# Patient Record
Sex: Female | Born: 1935 | Race: White | Hispanic: No | Marital: Married | Smoking: Never smoker
Health system: Southern US, Community
[De-identification: ages and names within clinical notes are randomized; demographics above are authoritative.]

## PROBLEM LIST (undated history)

## (undated) DIAGNOSIS — F0390 Unspecified dementia without behavioral disturbance: Secondary | ICD-10-CM

## (undated) DIAGNOSIS — K5790 Diverticulosis of intestine, part unspecified, without perforation or abscess without bleeding: Secondary | ICD-10-CM

## (undated) DIAGNOSIS — H3530 Unspecified macular degeneration: Secondary | ICD-10-CM

## (undated) DIAGNOSIS — I10 Essential (primary) hypertension: Secondary | ICD-10-CM

## (undated) DIAGNOSIS — T7840XA Allergy, unspecified, initial encounter: Secondary | ICD-10-CM

## (undated) DIAGNOSIS — N3281 Overactive bladder: Secondary | ICD-10-CM

## (undated) DIAGNOSIS — Z972 Presence of dental prosthetic device (complete) (partial): Secondary | ICD-10-CM

## (undated) DIAGNOSIS — R609 Edema, unspecified: Secondary | ICD-10-CM

## (undated) DIAGNOSIS — K08109 Complete loss of teeth, unspecified cause, unspecified class: Secondary | ICD-10-CM

## (undated) DIAGNOSIS — IMO0002 Reserved for concepts with insufficient information to code with codable children: Secondary | ICD-10-CM

## (undated) DIAGNOSIS — K2970 Gastritis, unspecified, without bleeding: Secondary | ICD-10-CM

## (undated) DIAGNOSIS — D649 Anemia, unspecified: Secondary | ICD-10-CM

## (undated) HISTORY — DX: Gastritis, unspecified, without bleeding: K29.70

## (undated) HISTORY — DX: Overactive bladder: N32.81

## (undated) HISTORY — DX: Reserved for concepts with insufficient information to code with codable children: IMO0002

## (undated) HISTORY — DX: Diverticulosis of intestine, part unspecified, without perforation or abscess without bleeding: K57.90

## (undated) HISTORY — DX: Essential (primary) hypertension: I10

## (undated) HISTORY — PX: COLONOSCOPY: SHX174

## (undated) HISTORY — PX: CATARACT EXTRACTION: SUR2

## (undated) HISTORY — DX: Edema, unspecified: R60.9

## (undated) HISTORY — DX: Anemia, unspecified: D64.9

## (undated) HISTORY — DX: Unspecified dementia, unspecified severity, without behavioral disturbance, psychotic disturbance, mood disturbance, and anxiety: F03.90

## (undated) HISTORY — DX: Allergy, unspecified, initial encounter: T78.40XA

## (undated) HISTORY — DX: Unspecified macular degeneration: H35.30

---

## 1997-12-26 LAB — FECAL OCCULT BLOOD, GUAIAC: Fecal Occult Blood: NEGATIVE

## 1999-05-01 ENCOUNTER — Encounter: Payer: Self-pay | Admitting: Family Medicine

## 1999-05-01 ENCOUNTER — Encounter: Admission: RE | Admit: 1999-05-01 | Discharge: 1999-05-01 | Payer: Self-pay | Admitting: Family Medicine

## 2000-05-02 ENCOUNTER — Encounter: Admission: RE | Admit: 2000-05-02 | Discharge: 2000-05-02 | Payer: Self-pay | Admitting: Family Medicine

## 2000-05-02 ENCOUNTER — Encounter: Payer: Self-pay | Admitting: Family Medicine

## 2000-05-02 LAB — HM MAMMOGRAPHY: HM Mammogram: NORMAL

## 2002-08-19 ENCOUNTER — Encounter: Admission: RE | Admit: 2002-08-19 | Discharge: 2002-08-19 | Payer: Self-pay | Admitting: Orthopaedic Surgery

## 2002-08-19 ENCOUNTER — Encounter: Payer: Self-pay | Admitting: Orthopaedic Surgery

## 2002-09-15 ENCOUNTER — Encounter: Payer: Self-pay | Admitting: Orthopaedic Surgery

## 2002-09-15 ENCOUNTER — Encounter: Admission: RE | Admit: 2002-09-15 | Discharge: 2002-09-15 | Payer: Self-pay | Admitting: Orthopaedic Surgery

## 2002-09-15 ENCOUNTER — Encounter: Payer: Self-pay | Admitting: Diagnostic Radiology

## 2002-09-29 ENCOUNTER — Encounter: Payer: Self-pay | Admitting: Orthopaedic Surgery

## 2002-09-29 ENCOUNTER — Encounter: Admission: RE | Admit: 2002-09-29 | Discharge: 2002-09-29 | Payer: Self-pay | Admitting: Orthopaedic Surgery

## 2002-10-09 ENCOUNTER — Inpatient Hospital Stay (HOSPITAL_COMMUNITY): Admission: EM | Admit: 2002-10-09 | Discharge: 2002-10-11 | Payer: Self-pay | Admitting: Emergency Medicine

## 2002-10-09 ENCOUNTER — Encounter: Payer: Self-pay | Admitting: Emergency Medicine

## 2002-10-11 ENCOUNTER — Encounter (INDEPENDENT_AMBULATORY_CARE_PROVIDER_SITE_OTHER): Payer: Self-pay | Admitting: *Deleted

## 2002-10-15 ENCOUNTER — Encounter: Payer: Self-pay | Admitting: Orthopaedic Surgery

## 2002-10-15 ENCOUNTER — Encounter: Admission: RE | Admit: 2002-10-15 | Discharge: 2002-10-15 | Payer: Self-pay | Admitting: Orthopaedic Surgery

## 2002-12-02 ENCOUNTER — Encounter: Payer: Self-pay | Admitting: Orthopaedic Surgery

## 2002-12-02 ENCOUNTER — Encounter: Admission: RE | Admit: 2002-12-02 | Discharge: 2002-12-02 | Payer: Self-pay | Admitting: Orthopaedic Surgery

## 2003-12-03 ENCOUNTER — Encounter: Admission: RE | Admit: 2003-12-03 | Discharge: 2003-12-03 | Payer: Self-pay | Admitting: Orthopaedic Surgery

## 2003-12-12 ENCOUNTER — Encounter: Admission: RE | Admit: 2003-12-12 | Discharge: 2003-12-12 | Payer: Self-pay | Admitting: Orthopaedic Surgery

## 2003-12-26 ENCOUNTER — Encounter: Admission: RE | Admit: 2003-12-26 | Discharge: 2003-12-26 | Payer: Self-pay | Admitting: Orthopaedic Surgery

## 2004-05-18 ENCOUNTER — Ambulatory Visit: Payer: Self-pay | Admitting: Family Medicine

## 2004-06-19 ENCOUNTER — Ambulatory Visit: Payer: Self-pay | Admitting: Family Medicine

## 2004-10-23 ENCOUNTER — Ambulatory Visit: Payer: Self-pay | Admitting: Family Medicine

## 2004-11-13 ENCOUNTER — Ambulatory Visit: Payer: Self-pay | Admitting: Family Medicine

## 2004-12-25 ENCOUNTER — Ambulatory Visit: Payer: Self-pay | Admitting: Family Medicine

## 2005-04-08 ENCOUNTER — Ambulatory Visit: Payer: Self-pay | Admitting: Family Medicine

## 2005-05-15 ENCOUNTER — Ambulatory Visit: Payer: Self-pay | Admitting: Family Medicine

## 2005-11-14 ENCOUNTER — Ambulatory Visit: Payer: Self-pay | Admitting: Family Medicine

## 2006-06-11 ENCOUNTER — Ambulatory Visit: Payer: Self-pay | Admitting: Family Medicine

## 2006-11-26 ENCOUNTER — Ambulatory Visit: Payer: Self-pay | Admitting: Family Medicine

## 2006-11-26 DIAGNOSIS — E7800 Pure hypercholesterolemia, unspecified: Secondary | ICD-10-CM | POA: Insufficient documentation

## 2006-11-26 DIAGNOSIS — I10 Essential (primary) hypertension: Secondary | ICD-10-CM | POA: Insufficient documentation

## 2006-11-28 LAB — CONVERTED CEMR LAB
AST: 16 units/L (ref 0–37)
CO2: 30 meq/L (ref 19–32)
Calcium: 9.3 mg/dL (ref 8.4–10.5)
Chloride: 108 meq/L (ref 96–112)
Cholesterol: 159 mg/dL (ref 0–200)
Glucose, Bld: 96 mg/dL (ref 70–99)
HDL: 32.8 mg/dL — ABNORMAL LOW (ref >39.0)
LDL Cholesterol: 106 mg/dL — ABNORMAL HIGH (ref 0–99)
Sodium: 143 meq/L (ref 135–145)
Triglycerides: 101 mg/dL (ref 0–149)
VLDL: 20 mg/dL (ref 0–40)

## 2006-12-12 ENCOUNTER — Ambulatory Visit: Payer: Self-pay | Admitting: Internal Medicine

## 2006-12-12 DIAGNOSIS — K644 Residual hemorrhoidal skin tags: Secondary | ICD-10-CM | POA: Insufficient documentation

## 2007-01-26 ENCOUNTER — Ambulatory Visit: Payer: Self-pay | Admitting: Family Medicine

## 2007-02-02 ENCOUNTER — Telehealth (INDEPENDENT_AMBULATORY_CARE_PROVIDER_SITE_OTHER): Payer: Self-pay | Admitting: *Deleted

## 2007-02-03 ENCOUNTER — Telehealth (INDEPENDENT_AMBULATORY_CARE_PROVIDER_SITE_OTHER): Payer: Self-pay | Admitting: Internal Medicine

## 2007-02-09 ENCOUNTER — Ambulatory Visit: Payer: Self-pay | Admitting: Family Medicine

## 2007-02-10 ENCOUNTER — Telehealth (INDEPENDENT_AMBULATORY_CARE_PROVIDER_SITE_OTHER): Payer: Self-pay | Admitting: Internal Medicine

## 2007-02-10 LAB — CONVERTED CEMR LAB
Basophils Relative: 3 % — ABNORMAL HIGH (ref 0.0–1.0)
Eosinophils Absolute: 0.3 10*3/uL (ref 0.0–0.6)
Eosinophils Relative: 2.3 % (ref 0.0–5.0)
Hemoglobin: 10.9 g/dL — ABNORMAL LOW (ref 12.0–15.0)
Lymphocytes Relative: 16.6 % (ref 12.0–46.0)
MCV: 78.8 fL (ref 78.0–100.0)
Monocytes Absolute: 0.7 10*3/uL (ref 0.2–0.7)
Neutro Abs: 8.3 10*3/uL — ABNORMAL HIGH (ref 1.4–7.7)
Neutrophils Relative %: 71.8 % (ref 43.0–77.0)
WBC: 11.5 10*3/uL — ABNORMAL HIGH (ref 4.5–10.5)

## 2007-02-12 ENCOUNTER — Ambulatory Visit: Payer: Self-pay | Admitting: Internal Medicine

## 2007-02-12 ENCOUNTER — Ambulatory Visit: Payer: Self-pay | Admitting: Family Medicine

## 2007-02-12 ENCOUNTER — Inpatient Hospital Stay (HOSPITAL_COMMUNITY): Admission: AD | Admit: 2007-02-12 | Discharge: 2007-02-16 | Payer: Self-pay | Admitting: Internal Medicine

## 2007-02-12 ENCOUNTER — Encounter (INDEPENDENT_AMBULATORY_CARE_PROVIDER_SITE_OTHER): Payer: Self-pay | Admitting: Internal Medicine

## 2007-02-13 ENCOUNTER — Encounter: Payer: Self-pay | Admitting: Gastroenterology

## 2007-02-13 ENCOUNTER — Encounter (INDEPENDENT_AMBULATORY_CARE_PROVIDER_SITE_OTHER): Payer: Self-pay | Admitting: Internal Medicine

## 2007-02-13 ENCOUNTER — Encounter: Payer: Self-pay | Admitting: Family Medicine

## 2007-02-13 LAB — HM COLONOSCOPY

## 2007-02-16 ENCOUNTER — Encounter: Payer: Self-pay | Admitting: Family Medicine

## 2007-02-17 ENCOUNTER — Ambulatory Visit: Payer: Self-pay | Admitting: Gastroenterology

## 2007-03-05 DIAGNOSIS — N390 Urinary tract infection, site not specified: Secondary | ICD-10-CM

## 2007-03-05 DIAGNOSIS — M810 Age-related osteoporosis without current pathological fracture: Secondary | ICD-10-CM | POA: Insufficient documentation

## 2007-03-05 DIAGNOSIS — J309 Allergic rhinitis, unspecified: Secondary | ICD-10-CM | POA: Insufficient documentation

## 2007-03-05 DIAGNOSIS — K5730 Diverticulosis of large intestine without perforation or abscess without bleeding: Secondary | ICD-10-CM | POA: Insufficient documentation

## 2007-03-05 DIAGNOSIS — M1990 Unspecified osteoarthritis, unspecified site: Secondary | ICD-10-CM | POA: Insufficient documentation

## 2007-03-05 DIAGNOSIS — H3530 Unspecified macular degeneration: Secondary | ICD-10-CM

## 2007-03-06 ENCOUNTER — Ambulatory Visit: Payer: Self-pay | Admitting: Family Medicine

## 2007-03-06 DIAGNOSIS — K5150 Left sided colitis without complications: Secondary | ICD-10-CM | POA: Insufficient documentation

## 2007-03-09 LAB — CONVERTED CEMR LAB
BUN: 6 mg/dL (ref 6–23)
Basophils Absolute: 0 10*3/uL (ref 0.0–0.1)
Chloride: 101 meq/L (ref 96–112)
Eosinophils Absolute: 0.3 10*3/uL (ref 0.0–0.6)
GFR calc non Af Amer: 101 mL/min
MCHC: 33.5 g/dL (ref 30.0–36.0)
MCV: 78.7 fL (ref 78.0–100.0)
Monocytes Relative: 9.5 % (ref 3.0–11.0)
Neutrophils Relative %: 58.7 % (ref 43.0–77.0)
Platelets: 398 10*3/uL (ref 150–400)
RBC: 4.34 M/uL (ref 3.87–5.11)
Sodium: 139 meq/L (ref 135–145)

## 2007-03-11 ENCOUNTER — Encounter: Payer: Self-pay | Admitting: Family Medicine

## 2007-03-19 ENCOUNTER — Ambulatory Visit: Payer: Self-pay | Admitting: Internal Medicine

## 2007-03-19 LAB — CONVERTED CEMR LAB
Specific Gravity, Urine: 1.025 (ref 1.000–1.03)
Total Protein, Urine: 100 mg/dL — AB
pH: 6 (ref 5.0–8.0)

## 2007-03-22 ENCOUNTER — Encounter: Payer: Self-pay | Admitting: Internal Medicine

## 2007-04-20 ENCOUNTER — Telehealth: Payer: Self-pay | Admitting: Family Medicine

## 2007-04-30 ENCOUNTER — Telehealth: Payer: Self-pay | Admitting: Family Medicine

## 2007-05-27 ENCOUNTER — Encounter (INDEPENDENT_AMBULATORY_CARE_PROVIDER_SITE_OTHER): Payer: Self-pay | Admitting: Internal Medicine

## 2007-05-27 ENCOUNTER — Ambulatory Visit: Payer: Self-pay | Admitting: Family Medicine

## 2007-05-27 ENCOUNTER — Encounter: Payer: Self-pay | Admitting: Internal Medicine

## 2007-05-27 ENCOUNTER — Ambulatory Visit: Payer: Self-pay | Admitting: Internal Medicine

## 2007-05-27 DIAGNOSIS — D649 Anemia, unspecified: Secondary | ICD-10-CM

## 2007-05-27 LAB — CONVERTED CEMR LAB
Bacteria, UA: 0
Nitrite: NEGATIVE
Specific Gravity, Urine: 1.025
Urobilinogen, UA: NEGATIVE
pH: 6

## 2007-06-09 ENCOUNTER — Ambulatory Visit: Payer: Self-pay | Admitting: Family Medicine

## 2007-06-10 ENCOUNTER — Encounter (INDEPENDENT_AMBULATORY_CARE_PROVIDER_SITE_OTHER): Payer: Self-pay | Admitting: Internal Medicine

## 2007-09-01 ENCOUNTER — Encounter: Payer: Self-pay | Admitting: Internal Medicine

## 2007-09-01 ENCOUNTER — Telehealth: Payer: Self-pay | Admitting: Internal Medicine

## 2007-12-11 ENCOUNTER — Ambulatory Visit: Payer: Self-pay | Admitting: Family Medicine

## 2007-12-11 DIAGNOSIS — R350 Frequency of micturition: Secondary | ICD-10-CM

## 2007-12-11 LAB — CONVERTED CEMR LAB
Casts: 0 /lpf
Nitrite: NEGATIVE
Specific Gravity, Urine: <1.005
Urine crystals, microscopic: 0 /hpf
Urobilinogen, UA: 0.2

## 2007-12-12 ENCOUNTER — Encounter: Payer: Self-pay | Admitting: Family Medicine

## 2007-12-15 LAB — CONVERTED CEMR LAB: Vit D, 1,25-Dihydroxy: 22 — ABNORMAL LOW (ref 30–89)

## 2007-12-16 LAB — CONVERTED CEMR LAB
ALT: 11 units/L (ref 0–35)
AST: 20 units/L (ref 0–37)
Albumin: 3.5 g/dL (ref 3.5–5.2)
Alkaline Phosphatase: 75 units/L (ref 39–117)
Basophils Absolute: 0.1 10*3/uL (ref 0.0–0.1)
Basophils Relative: 1.3 % (ref 0.0–3.0)
Calcium: 9.2 mg/dL (ref 8.4–10.5)
Cholesterol: 163 mg/dL (ref 0–200)
Creatinine, Ser: 0.8 mg/dL (ref 0.4–1.2)
Eosinophils Relative: 6 % — ABNORMAL HIGH (ref 0.0–5.0)
GFR calc Af Amer: 87 mL/min
GFR calc non Af Amer: 72 mL/min
Hemoglobin: 12 g/dL (ref 12.0–15.0)
LDL Cholesterol: 101 mg/dL — ABNORMAL HIGH (ref 0–99)
Lymphocytes Relative: 30.2 % (ref 12.0–46.0)
MCHC: 33.8 g/dL (ref 30.0–36.0)
Neutro Abs: 3.2 10*3/uL (ref 1.4–7.7)
Neutrophils Relative %: 52.8 % (ref 43.0–77.0)
Phosphorus: 3.9 mg/dL (ref 2.3–4.6)
RBC: 4.39 M/uL (ref 3.87–5.11)
Sodium: 141 meq/L (ref 135–145)
TSH: 1.26 microintl units/mL (ref 0.35–5.50)
Total Protein: 7.8 g/dL (ref 6.0–8.3)
VLDL: 31 mg/dL (ref 0–40)
WBC: 6.2 10*3/uL (ref 4.5–10.5)

## 2008-02-03 ENCOUNTER — Telehealth (INDEPENDENT_AMBULATORY_CARE_PROVIDER_SITE_OTHER): Payer: Self-pay | Admitting: *Deleted

## 2008-02-09 ENCOUNTER — Telehealth: Payer: Self-pay | Admitting: Internal Medicine

## 2008-02-09 ENCOUNTER — Ambulatory Visit: Payer: Self-pay | Admitting: Gastroenterology

## 2008-02-23 ENCOUNTER — Telehealth: Payer: Self-pay | Admitting: Internal Medicine

## 2008-02-25 ENCOUNTER — Telehealth (INDEPENDENT_AMBULATORY_CARE_PROVIDER_SITE_OTHER): Payer: Self-pay | Admitting: *Deleted

## 2008-06-30 ENCOUNTER — Encounter (INDEPENDENT_AMBULATORY_CARE_PROVIDER_SITE_OTHER): Payer: Self-pay | Admitting: Internal Medicine

## 2008-11-22 ENCOUNTER — Ambulatory Visit: Payer: Self-pay | Admitting: Family Medicine

## 2008-11-22 LAB — CONVERTED CEMR LAB
Ketones, urine, test strip: NEGATIVE
LDL Goal: 130 mg/dL
Nitrite: NEGATIVE
Urobilinogen, UA: 0.2
pH: 6

## 2008-11-23 ENCOUNTER — Encounter: Payer: Self-pay | Admitting: Family Medicine

## 2008-11-25 LAB — CONVERTED CEMR LAB
BUN: 11 mg/dL (ref 6–23)
Basophils Absolute: 0.1 10*3/uL (ref 0.0–0.1)
CO2: 30 meq/L (ref 19–32)
Creatinine, Ser: 0.8 mg/dL (ref 0.4–1.2)
GFR calc non Af Amer: 71.85 mL/min (ref >60)
Glucose, Bld: 93 mg/dL (ref 70–99)
HCT: 33.5 % — ABNORMAL LOW (ref 36.0–46.0)
HDL: 27.2 mg/dL — ABNORMAL LOW (ref >39.00)
Lymphs Abs: 1.6 10*3/uL (ref 0.7–4.0)
MCV: 75 fL — ABNORMAL LOW (ref 78.0–100.0)
Monocytes Absolute: 0.6 10*3/uL (ref 0.1–1.0)
Monocytes Relative: 8.4 % (ref 3.0–12.0)
Neutrophils Relative %: 58.8 % (ref 43.0–77.0)
Platelets: 449 10*3/uL — ABNORMAL HIGH (ref 150.0–400.0)
RDW: 15.1 % — ABNORMAL HIGH (ref 11.5–14.6)
TSH: 1.54 microintl units/mL (ref 0.35–5.50)
Total CHOL/HDL Ratio: 6
Triglycerides: 186 mg/dL — ABNORMAL HIGH (ref 0.0–149.0)
VLDL: 37.2 mg/dL (ref 0.0–40.0)

## 2008-11-30 ENCOUNTER — Encounter (INDEPENDENT_AMBULATORY_CARE_PROVIDER_SITE_OTHER): Payer: Self-pay | Admitting: *Deleted

## 2008-12-29 ENCOUNTER — Ambulatory Visit: Payer: Self-pay | Admitting: Family Medicine

## 2008-12-30 LAB — CONVERTED CEMR LAB
Basophils Absolute: 0 10*3/uL (ref 0.0–0.1)
Eosinophils Absolute: 0.3 10*3/uL (ref 0.0–0.7)
Ferritin: 8.8 ng/mL — ABNORMAL LOW (ref 10.0–291.0)
MCHC: 32.9 g/dL (ref 30.0–36.0)
MCV: 76.6 fL — ABNORMAL LOW (ref 78.0–100.0)
Monocytes Absolute: 0.6 10*3/uL (ref 0.1–1.0)
Neutrophils Relative %: 49.3 % (ref 43.0–77.0)
Platelets: 363 10*3/uL (ref 150.0–400.0)
RDW: 16.6 % — ABNORMAL HIGH (ref 11.5–14.6)

## 2009-01-02 ENCOUNTER — Telehealth: Payer: Self-pay | Admitting: Family Medicine

## 2009-02-07 ENCOUNTER — Telehealth: Payer: Self-pay | Admitting: Internal Medicine

## 2009-04-03 ENCOUNTER — Ambulatory Visit: Payer: Self-pay | Admitting: Family Medicine

## 2009-04-03 LAB — CONVERTED CEMR LAB
AST: 16 units/L (ref 0–37)
BUN: 13 mg/dL (ref 6–23)
CO2: 29 meq/L (ref 19–32)
Chloride: 107 meq/L (ref 96–112)
Cholesterol: 178 mg/dL (ref 0–200)
Eosinophils Relative: 0 % (ref 0.0–5.0)
Ferritin: 24.9 ng/mL (ref 10.0–291.0)
GFR calc non Af Amer: 71.79 mL/min (ref >60)
HCT: 38.6 % (ref 36.0–46.0)
Hemoglobin: 12.5 g/dL (ref 12.0–15.0)
LDL Cholesterol: 105 mg/dL — ABNORMAL HIGH (ref 0–99)
Lymphs Abs: 1.8 10*3/uL (ref 0.7–4.0)
Monocytes Relative: 7.5 % (ref 3.0–12.0)
Platelets: 321 10*3/uL (ref 150.0–400.0)
RBC: 4.76 M/uL (ref 3.87–5.11)
WBC: 4.8 10*3/uL (ref 4.5–10.5)

## 2009-04-12 ENCOUNTER — Ambulatory Visit: Payer: Self-pay | Admitting: Family Medicine

## 2009-10-09 ENCOUNTER — Ambulatory Visit: Payer: Self-pay | Admitting: Family Medicine

## 2009-10-09 DIAGNOSIS — F0390 Unspecified dementia without behavioral disturbance: Secondary | ICD-10-CM

## 2009-10-10 LAB — CONVERTED CEMR LAB
ALT: 9 units/L (ref 0–35)
AST: 15 units/L (ref 0–37)
Albumin: 4.1 g/dL (ref 3.5–5.2)
BUN: 15 mg/dL (ref 6–23)
Cholesterol: 184 mg/dL (ref 0–200)
Creatinine, Ser: 0.7 mg/dL (ref 0.4–1.2)
Eosinophils Absolute: 0 10*3/uL (ref 0.0–0.7)
Glucose, Bld: 85 mg/dL (ref 70–99)
HDL: 41.3 mg/dL (ref >39.00)
MCHC: 33.5 g/dL (ref 30.0–36.0)
MCV: 85 fL (ref 78.0–100.0)
Monocytes Absolute: 0.4 10*3/uL (ref 0.1–1.0)
Neutrophils Relative %: 74.6 % (ref 43.0–77.0)
Phosphorus: 4.3 mg/dL (ref 2.3–4.6)
Platelets: 322 10*3/uL (ref 150.0–400.0)
Potassium: 5 meq/L (ref 3.5–5.1)
RDW: 13 % (ref 11.5–14.6)
VLDL: 43.2 mg/dL — ABNORMAL HIGH (ref 0.0–40.0)

## 2009-10-12 ENCOUNTER — Ambulatory Visit: Payer: Self-pay | Admitting: Family Medicine

## 2009-10-12 LAB — CONVERTED CEMR LAB: Vit D, 25-Hydroxy: 27 ng/mL — ABNORMAL LOW (ref 30–89)

## 2009-10-19 ENCOUNTER — Ambulatory Visit: Payer: Self-pay | Admitting: Family Medicine

## 2009-10-19 ENCOUNTER — Telehealth: Payer: Self-pay | Admitting: Family Medicine

## 2009-10-26 ENCOUNTER — Ambulatory Visit: Payer: Self-pay | Admitting: Family Medicine

## 2009-10-31 ENCOUNTER — Telehealth: Payer: Self-pay | Admitting: Internal Medicine

## 2009-11-02 ENCOUNTER — Ambulatory Visit: Payer: Self-pay | Admitting: Family Medicine

## 2009-11-09 ENCOUNTER — Ambulatory Visit: Payer: Self-pay | Admitting: Family Medicine

## 2009-11-09 DIAGNOSIS — E538 Deficiency of other specified B group vitamins: Secondary | ICD-10-CM

## 2009-11-14 ENCOUNTER — Telehealth: Payer: Self-pay | Admitting: Family Medicine

## 2009-11-23 ENCOUNTER — Telehealth: Payer: Self-pay | Admitting: Family Medicine

## 2009-11-24 ENCOUNTER — Telehealth: Payer: Self-pay | Admitting: Family Medicine

## 2009-12-04 ENCOUNTER — Telehealth: Payer: Self-pay | Admitting: Family Medicine

## 2009-12-07 ENCOUNTER — Ambulatory Visit: Payer: Self-pay | Admitting: Internal Medicine

## 2009-12-13 ENCOUNTER — Telehealth: Payer: Self-pay | Admitting: Family Medicine

## 2009-12-27 ENCOUNTER — Telehealth: Payer: Self-pay | Admitting: Family Medicine

## 2010-03-27 NOTE — Progress Notes (Signed)
Summary: when does pt need labs?  Phone Note Call from Patient Call back at Home Phone [phone] Call back at [phone]   Caller: Daughter  Becky Summary of Call: Pt's daughter is asking when is pt due for more blood work.  She had appt to see you today but daughter cancelled it because pt didnt feel like coming in. Initial call taken by: Marty Heck CMA, AAMA,  December 27, 2009 10:30 AM  Follow-up for Phone Call        I think we could wait until feb for labs -- B12, vit D , renal / ast/alt and lipids 272, 401.1, B12 def and vit D def   Follow-up by: Allena Earing MD,  December 27, 2009 11:23 AM  Additional Follow-up for Phone Call Additional follow up Details #1::        Patient's daughter  notified as instructed by telephone. Pt scheduled 04/02/09 at 11:30 to see Dr Glori Bickers with labs to follow. Pt's daughter wanted pt to see Dr Glori Bickers when she had labs.Ozzie Hoyle LPN  December 28, 8567 12:44 PM

## 2010-03-27 NOTE — Assessment & Plan Note (Signed)
Summary: VIT B12 INJECTION PER DR TOWER/RI   Nurse Visit   Allergies: 1)  ! Ace Inhibitors 2)  ! Lipitor 3)  ! * Tenex 4)  ! * Vytorin 5)  ! * Hctz 6)  ! * Miaicalcin 7)  ! * Rhinacort Aqua 8)  ! * Detrol La  Medication Administration  Injection # 1:    Medication: Vit B12 1000 mcg    Diagnosis: ANEMIA (TOI-712.9)    Route: IM    Site: L deltoid    Exp Date: 05/27/2011    Lot #: [id number]    Mfr: [employer] Regent    Patient tolerated injection without complications    Given by: Edwin Dada CMA ([place]) (October 26, 2009 10:11 AM)  Orders Added: 1)  Vit B12 1000 mcg [J3420] 2)  Admin of Therapeutic Inj  intramuscular or subcutaneous [[id number]]   Medication Administration  Injection # 1:    Medication: Vit B12 1000 mcg    Diagnosis: ANEMIA (ICD-285.9)    Route: IM    Site: L deltoid    Exp Date: 05/27/2011    Lot #: [id number]    Mfr: American Regent    Patient tolerated injection without complications    Given by: Edwin Dada CMA ([place]) (October 26, 2009 10:11 AM)  Orders Added: 1)  Vit B12 1000 mcg [J3420] 2)  Admin of Therapeutic Inj  intramuscular or subcutaneous [[id number]]

## 2010-03-27 NOTE — Assessment & Plan Note (Signed)
Summary: VIT B 12 INJECTION PER DR TOWER/RI   Nurse Visit   Allergies: 1)  ! Ace Inhibitors 2)  ! Lipitor 3)  ! * Tenex 4)  ! * Vytorin 5)  ! * Hctz 6)  ! * Miaicalcin 7)  ! * Rhinacort Aqua 8)  ! * Detrol La  Medication Administration  Injection # 1:    Medication: Vit B12 1000 mcg    Diagnosis: ANEMIA (RWP-100.9)    Route: IM    Site: L deltoid    Exp Date: 05/2011    Lot #: [id number]    Mfr: American Regent    Patient tolerated injection without complications    Given by: Marty Heck CMA (October 13, 2009 3:02 PM)  Orders Added: 1)  Vit B12 1000 mcg [J3420] 2)  Admin of Therapeutic Inj  intramuscular or subcutaneous [96372]   Medication Administration  Injection # 1:    Medication: Vit B12 1000 mcg    Diagnosis: ANEMIA (ICD-285.9)    Route: IM    Site: L deltoid    Exp Date: 05/2011    Lot #: [id number]    Mfr: American Regent    Patient tolerated injection without complications    Given by: Marty Heck CMA (October 13, 2009 3:02 PM)  Orders Added: 1)  Vit B12 1000 mcg [J3420] 2)  Admin of Therapeutic Inj  intramuscular or subcutaneous [96372] Injection was actually given yesterday, computers were down so I was unable to record.

## 2010-03-27 NOTE — Miscellaneous (Signed)
Summary: Vitamin D 2000iu added to med list  Medications Added VITAMIN D 2000 UNIT TABS (CHOLECALCIFEROL) Take 1 tablet by mouth two times a day       Clinical Lists Changes  Medications: Added new medication of VITAMIN D 2000 UNIT TABS (CHOLECALCIFEROL) Take 1 tablet by mouth two times a day     Current Allergies: ! ACE INHIBITORS ! LIPITOR ! * TENEX ! * VYTORIN ! * HCTZ ! * MIAICALCIN ! * RHINACORT AQUA ! * DETROL LA

## 2010-03-27 NOTE — Progress Notes (Signed)
Summary: Handicapped Placard  Phone Note Call from Patient Call back at Home Phone [phone]   Caller: Patient Call For: Allena Earing MD Summary of Call: Patient came in today for Vitamin B12 injection and dropped off form for handicapped placard.  Please call when form ready, form in your IN box.   Initial call taken by: Sherrian Divers CMA Deborra Medina),  October 19, 2009 10:20 AM  Follow-up for Phone Call        form done and in nurse in box  Follow-up by: Allena Earing MD,  October 19, 2009 11:17 AM  Additional Follow-up for Phone Call Additional follow up Details #1::        Left message for patient's daughter Ashley House to call back. Form left at front desk. Copy of form sent for scanning. Ozzie Hoyle LPN  October 20, 7891 2:26 PM   Patients daughter Ashley House notified as instructed by telephone. Will pick up 10/26/09.Ozzie Hoyle LPN  October 20, 8099 8:38 AM

## 2010-03-27 NOTE — Progress Notes (Signed)
Summary: pt is depressed  Phone Note Call from Patient   Caller: Daughter Vaughan Basta  Call For: Allena Earing MD Summary of Call: Pt's daughter is asking if pt can be started on anti depressant.  She is agitated, up one day and down the next.  Daughter does not want pt to have aricept because of the side effects.  Daughter thinks pt is depressed because she cant see and cant do, and she has always been very active.  [facility] [address].  Please call daughter Jacqlyn Larsen and let her know, [phone]. Initial call taken by: Marty Heck CMA,  December 13, 2009 11:52 AM  Follow-up for Phone Call        I need her to f/u with me for this -- we will disc symptoms in detail to figure out what med to try and also disc pros/ cons/ side eff to antidepressants please sched 30 min if poss Follow-up by: Allena Earing MD,  December 13, 2009 12:45 PM  Additional Follow-up for Phone Call Additional follow up Details #1::        Jacqlyn Larsen, pt's daughter  notified as instructed by telephone. 30 min appt scheduled 12/27/09 at 12:15pm. Becky understands that pt needs to know why she is coming in for the appt per Dr Glori Bickers.Ozzie Hoyle LPN  December 13, 1101 3:18 PM

## 2010-03-27 NOTE — Progress Notes (Signed)
Summary: Wants generic Aricept  Phone Note Call from Patient Call back at [phone]   Caller: Patient's daughter, Ashley House Call For: Allena Earing MD Summary of Call: Pt's daughter, Ashley House wants to try pt on Aricept generic. Ashley House said had discussed previously with dr Glori Bickers. Would like 30 day supply of the generic Aricept [phone]. Ashley House will call with how pt responds to med. Ashley House can be reached [phone]. Initial call taken by: Ozzie Hoyle LPN,  November 24, 8314 10:27 AM  Follow-up for Phone Call        px written on EMR for call in will start with 5 -- if ok with that after several months will inc f/u with me 2 mo   Follow-up by: Allena Earing MD,  November 23, 2009 12:12 PM  Additional Follow-up for Phone Call Additional follow up Details #1::        Patient's daughter, Ashley House notified as instructed by telephone. Ashley House will ck her schedule and call and make f/u appt. Medication [facility]  [place] as instructed. Ozzie Hoyle LPN  November 24, 7423 1:02 PM     New/Updated Medications: ARICEPT 5 MG TABS (DONEPEZIL HCL) 1 by mouth once daily in evening Prescriptions: ARICEPT 5 MG TABS (DONEPEZIL HCL) 1 by mouth once daily in evening  #30 x 5   Entered and Authorized by:   Allena Earing MD   Signed by:   Ozzie Hoyle LPN on 52/58/9483   Method used:   Telephoned to ...       [id number]       [address]       [place],   [address]       Ph: [phone]       Fax: [phone]   RxID:   [id number]

## 2010-03-27 NOTE — Assessment & Plan Note (Signed)
Summary: ROA 6 MTHS CYD   Vital Signs:  Patient profile:   77 year old female Weight:      135 pounds BMI:     24.78 Temp:     97.9 degrees F oral Pulse rate:   60 / minute Pulse rhythm:   regular BP sitting:   122 / 68  (left arm) Cuff size:   regular  Vitals Entered By: Emelia Salisbury LPN (October 10, 6211 11:47 AM) CC: 6 Month follow-up   History of Present Illness: here for f/u of HTN and anemia and lipids   is very difficult to get by with her loss of vision  also getting older -- and this is hard on her --77 years old  she gives herself a lot of credit -- somewhat self sufficient  has a walker and does not like it -- is more in her way   afraid to fall but not falls recently   HTN in good control with 122/68 today that has been very stable  needs refil on her med   gets her iron pills otc   some short term memory issues -- repeats herself occas  gets confused in new places  is up and down at night lately  husb is there to help and daughter to help both of them  they check on her meds   snacks all the time -- instead of eating meals   is time for labs for anemia and chol  pneumovax - is due for     Allergies: 1)  ! Ace Inhibitors 2)  ! Lipitor 3)  ! * Tenex 4)  ! * Vytorin 5)  ! * Hctz 6)  ! * Miaicalcin 7)  ! * Rhinacort Aqua 8)  ! * Detrol La  Past History:  Past Medical History: Last updated: 11/22/2008 Allergic rhinitis Diverticulosis, colon Hypertension Osteoarthritis Osteoporosis Left-sided ulcerative colitis 12/08 gastritis anemia ankle edema  macular degeneration- is legally blind  overactive bladder   Past Surgical History: Last updated: 06/30/2008 Lumbar disk disease Dexa- LS osteoporosis, left hip osteopenia (12/1997) Admit- lowe GI bleed (09/2002) Abn pap- ulcer, benign Colonoscopy- severe diverticulosis, polyp (10/2002), 12/08 = L UC, 8/04--colitis, diverticulosis, hemorrhoids EGD--10/11/02--hiatal hernia,  gastritis--Stark Cataract extraction- left (09/2003) Deg disk- back 12/08 hosp colitis, gastritis, uti  Family History: Last updated: 02/09/2008 Father:  Mother:  Siblings: brother- colon cancer no family hx of colitis  Social History: Last updated: 02/09/2008 Marital Status: Children: 2 girls, 2 boys Occupation: retired non smoker Alcohol Use - no Daily Caffeine Use-ice tea Illicit Drug Use - no Patient does not get regular exercise.   Risk Factors: Exercise: no (02/09/2008)  Risk Factors: Smoking Status: never (03/05/2007)  Review of Systems General:  Complains of fatigue; denies malaise and sweats. Eyes:  Complains of blurring and vision loss-both eyes; denies eye irritation and eye pain. CV:  Denies chest pain or discomfort, palpitations, shortness of breath with exertion, and swelling of feet. Resp:  Denies cough, shortness of breath, and wheezing. GI:  Denies abdominal pain and change in bowel habits; colitis has been doing ok . GU:  Complains of incontinence; denies dysuria. MS:  Complains of joint pain and stiffness. Derm:  Denies itching, lesion(s), poor wound healing, and rash. Neuro:  Complains of memory loss; denies inability to speak, numbness, tingling, tremors, and weakness. Psych:  Denies anxiety and depression. Endo:  Denies cold intolerance, excessive thirst, excessive urination, and heat intolerance. Heme:  Denies abnormal bruising and bleeding.  Physical Exam  General:  somewhat frail appearing elderly female Head:  normocephalic, atraumatic, and no abnormalities observed.   Eyes:  pupils equal, pupils round, and pupils reactive to light.  vision is baseline poor Ears:  R ear normal and L ear normal.   Mouth:  pharynx pink and moist.   Neck:  supple with full rom and no masses or thyromegally, no JVD or carotid bruit  Chest Wall:  No deformities, masses, or tenderness noted. Lungs:  Normal respiratory effort, chest expands symmetrically. Lungs  are clear to auscultation, no crackles or wheezes. Heart:  Normal rate and regular rhythm. S1 and S2 normal without gallop, murmur, click, rub or other extra sounds. Abdomen:  Bowel sounds positive,abdomen soft and non-tender without masses, organomegaly or hernias noted. no renal bruits  Msk:  No deformity or scoliosis noted of thoracic or lumbar spine.  no acute joint changes  Pulses:  R and L carotid,radial,femoral,dorsalis pedis and posterior tibial pulses are full and equal bilaterally Extremities:  No clubbing, cyanosis, edema, or deformity noted with normal full range of motion of all joints.   Neurologic:  sensation intact to light touch, gait normal, and DTRs symmetrical and normal.   Skin:  Intact without suspicious lesions or rashes Cervical Nodes:  No lymphadenopathy noted Inguinal Nodes:  No significant adenopathy Psych:  normal affect, talkative and pleasant  is mentally sharp today   Impression & Recommendations:  Problem # 1:  ANEMIA (ICD-285.9) Assessment Unchanged  this has been stable with colitis and low dose iron otc  re check today Orders: Venipuncture (26834) TLB-Lipid Panel (80061-LIPID) TLB-Renal Function Panel (80069-RENAL) TLB-TSH (Thyroid Stimulating Hormone) (84443-TSH) TLB-CBC Platelet - w/Differential (85025-CBCD) TLB-ALT (SGPT) (84460-ALT) TLB-AST (SGOT) (84450-SGOT) TLB-B12 + Folate Pnl (82746_82607-B12/FOL) T-Vitamin D (25-Hydroxy) (19622-29798)  Hgb: 12.5 (04/03/2009)   Hct: 38.6 (04/03/2009)   Platelets: 321.0 (04/03/2009) RBC: 4.76 (04/03/2009)   RDW: 16.8 (04/03/2009)   WBC: 4.8 (04/03/2009) MCV: 81.0 (04/03/2009)   MCHC: 32.4 (04/03/2009) Ferritin: 24.9 (04/03/2009) TSH: 1.54 (11/22/2008)  Problem # 2:  HYPERTENSION (ICD-401.9) Assessment: Unchanged  good control without change  lab today Her updated medication list for this problem includes:    Norvasc 5 Mg Tabs (Amlodipine besylate) .Marland Kitchen... Take 1 tablet by mouth once a  day  Orders: Venipuncture (92119) TLB-Lipid Panel (80061-LIPID) TLB-Renal Function Panel (80069-RENAL) TLB-TSH (Thyroid Stimulating Hormone) (84443-TSH) TLB-CBC Platelet - w/Differential (85025-CBCD) TLB-ALT (SGPT) (84460-ALT) TLB-AST (SGOT) (84450-SGOT) TLB-B12 + Folate Pnl (82746_82607-B12/FOL) T-Vitamin D (25-Hydroxy) (41740-81448)  BP today: 122/68 Prior BP: 118/64 (04/12/2009)  Prior 10 Yr Risk Heart Disease: 20 % (11/22/2008)  Labs Reviewed: K+: 4.4 (04/03/2009) Creat: : 0.8 (04/03/2009)   Chol: 178 (04/03/2009)   HDL: 44.60 (04/03/2009)   LDL: 105 (04/03/2009)   TG: 142.0 (04/03/2009)  Problem # 3:  MEMORY LOSS (ICD-780.93) Assessment: New highly suspect short term memory loss and easy confusion is age related change and disc this in detail pt is quite lucid today has safe home env with husband and lots of extra help some extra labs today gave fam member the option to investigate poss of aricept and let me know   Orders: Venipuncture (18563) TLB-Lipid Panel (80061-LIPID) TLB-Renal Function Panel (80069-RENAL) TLB-TSH (Thyroid Stimulating Hormone) (84443-TSH) TLB-CBC Platelet - w/Differential (85025-CBCD) TLB-ALT (SGPT) (84460-ALT) TLB-AST (SGOT) (84450-SGOT) TLB-B12 + Folate Pnl (82746_82607-B12/FOL) T-Vitamin D (25-Hydroxy) (14970-26378)  Problem # 4:  HYPERCHOLESTEROLEMIA, PURE (ICD-272.0) Assessment: Unchanged  labs today  has been well controlled with statin  pt eats what she wants- which is  not much  ice cream may elevate numbers- check today Her updated medication list for this problem includes:    Pravachol 40 Mg Tabs (Pravastatin sodium) .Marland Kitchen... Take 1 tablet by mouth once a day  Orders: Venipuncture (19758) TLB-Lipid Panel (80061-LIPID) TLB-Renal Function Panel (80069-RENAL) TLB-TSH (Thyroid Stimulating Hormone) (84443-TSH) TLB-CBC Platelet - w/Differential (85025-CBCD) TLB-ALT (SGPT) (84460-ALT) TLB-AST (SGOT) (84450-SGOT) TLB-B12 + Folate Pnl  [phone]) T-Vitamin D (25-Hydroxy) [phone])  Labs Reviewed: SGOT: 16 (04/03/2009)   SGPT: 9 (04/03/2009)  Lipid Goals: Chol Goal: 200 (11/22/2008)   HDL Goal: 40 (11/22/2008)   LDL Goal: 130 (11/22/2008)   TG Goal: 150 (11/22/2008)  Prior 10 Yr Risk Heart Disease: 20 % (11/22/2008)   HDL:44.60 (04/03/2009), 27.20 (11/22/2008)  LDL:105 (04/03/2009), 88 (11/22/2008)  Chol:178 (04/03/2009), 152 (11/22/2008)  Trig:142.0 (04/03/2009), 186.0 (11/22/2008)  Complete Medication List: 1)  Norvasc 5 Mg Tabs (Amlodipine besylate) .... Take 1 tablet by mouth once a day 2)  Pravachol 40 Mg Tabs (Pravastatin sodium) .... Take 1 tablet by mouth once a day 3)  Asacol 400 Mg Tbec (Mesalamine) .... 3  by mouth two times a day 4)  Iron Pill Otc Unsure of Mg  .... Otc as directed. 5)  Claritin 10 Mg Tabs (Loratadine) .Marland Kitchen.. 1 by mouth once daily as needed allergies  Other Orders: Pneumococcal Vaccine [phone]) Admin 1st Vaccine [phone]) Admin 1st Vaccine [facility]) [phone]) Specimen Handling (99000)  Patient Instructions: 1)  the medication to consider for dementia is aricept - look it up on line and let me know if you are interested  2)  labs today 3)  no change in medicine  4)  follow up in about 6 months   Current Allergies (reviewed today): ! ACE INHIBITORS ! LIPITOR ! * TENEX ! * VYTORIN ! * HCTZ ! * MIAICALCIN ! * RHINACORT AQUA ! * DETROL LA    Pneumovax Vaccine    Vaccine Type: Pneumovax    Site: left deltoid    Mfr: Merck    Dose: 0.5 ml    Route: IM    Given by: Emelia Salisbury LPN    Exp. Date: 03/14/2011    Lot #: [id number]    VIS given: 09/23/95 version given October 09, 2009.

## 2010-03-27 NOTE — Assessment & Plan Note (Signed)
Summary: 3 MTH FU/CLE   Vital Signs:  Patient profile:   77 year old House Height:      62 inches Weight:      138.25 pounds Temp:     97.6 degrees F oral Pulse rate:   72 / minute Pulse rhythm:   regular BP sitting:   118 / 64  (left arm) Cuff size:   regular  Vitals Entered By: Ozzie Hoyle LPN (April 12, 4494 11:27 AM)  History of Present Illness: here for f/u of lipids and HTN and anemia   is doing well overall - has chronic pain from oa does have constant allergy problems - all the time  constantly wiping nose  no otc meds  no fever no sinus pain or malaise   lipids stable with statin and diet trig 142/ HDL 44 and LDL 105 not a big eater in general - not much fatty foods   ferritin ok and nl hb on iron  is taking a low dose iron pill daily   HTN in good control today - no changes no headache or flushing   is becoming more and more forgetful lately - short term memory   her husband is still living and lots of family - also dauhter that is a nurse  does tend to get up during the night  has to get up to go to bathroom  does have bedside commode- does not use it    Allergies: 1)  ! Ace Inhibitors 2)  ! Lipitor 3)  ! * Tenex 4)  ! * Vytorin 5)  ! * Hctz 6)  ! * Miaicalcin 7)  ! * Rhinacort Aqua 8)  ! * Detrol La  Past History:  Past Medical History: Last updated: 11/22/2008 Allergic rhinitis Diverticulosis, colon Hypertension Osteoarthritis Osteoporosis Left-sided ulcerative colitis 12/08 gastritis anemia ankle edema  macular degeneration- is legally blind  overactive bladder   Past Surgical History: Last updated: 06/30/2008 Lumbar disk disease Dexa- LS osteoporosis, left hip osteopenia (12/1997) Admit- lowe GI bleed (09/2002) Abn pap- ulcer, benign Colonoscopy- severe diverticulosis, polyp (10/2002), 12/08 = L UC, 8/04--colitis, diverticulosis, hemorrhoids EGD--10/11/02--hiatal hernia, gastritis--Stark Cataract extraction- left  (09/2003) Deg disk- back 12/08 hosp colitis, gastritis, uti  Family History: Last updated: 02/09/2008 Father:  Mother:  Siblings: brother- colon cancer no family hx of colitis  Social History: Last updated: 02/09/2008 Marital Status: Children: 2 girls, 2 boys Occupation: retired non smoker Alcohol Use - no Daily Caffeine Use-ice tea Illicit Drug Use - no Patient does not get regular exercise.   Risk Factors: Exercise: no (02/09/2008)  Risk Factors: Smoking Status: never (03/05/2007)  Review of Systems General:  Denies fatigue, fever, loss of appetite, and malaise. Eyes:  Denies blurring and eye irritation. CV:  Denies chest pain or discomfort, palpitations, and shortness of breath with exertion. Resp:  Denies cough and wheezing. GI:  Denies abdominal pain and change in bowel habits. GU:  Complains of incontinence, nocturia, and urinary frequency; denies dysuria. MS:  Complains of joint pain; denies joint redness and joint swelling. Derm:  Denies itching, lesion(s), poor wound healing, and rash. Neuro:  Denies numbness and tingling. Psych:  mood is ok . Endo:  Denies excessive thirst and excessive urination.  Physical Exam  General:  somewhat frail appearing elderly House Head:  normocephalic, atraumatic, and no abnormalities observed.   Eyes:  pupils equal, pupils round, and pupils reactive to light.  vision is baseline poor Ears:  R ear normal and L ear  normal.   Mouth:  pharynx pink and moist.   Neck:  supple with full rom and no masses or thyromegally, no JVD or carotid bruit  Chest Wall:  No deformities, masses, or tenderness noted. Lungs:  Normal respiratory effort, chest expands symmetrically. Lungs are clear to auscultation, no crackles or wheezes. Heart:  Normal rate and regular rhythm. S1 and S2 normal without gallop, murmur, click, rub or other extra sounds. Abdomen:  Bowel sounds positive,abdomen soft and non-tender without masses, organomegaly or  hernias noted. no renal bruits  Msk:  No deformity or scoliosis noted of thoracic or lumbar spine.   Pulses:  R and L carotid,radial,femoral,dorsalis pedis and posterior tibial pulses are full and equal bilaterally Extremities:  No clubbing, cyanosis, edema, or deformity noted with normal full range of motion of all joints.   Neurologic:  sensation intact to light touch, gait normal, and DTRs symmetrical and normal.   Skin:  Intact without suspicious lesions or rashes Cervical Nodes:  No lymphadenopathy noted Inguinal Nodes:  No significant adenopathy Psych:  normal affect, talkative and pleasant    Impression & Recommendations:  Problem # 1:  HYPERTENSION, BENIGN ESSENTIAL (ICD-401.1) Assessment Unchanged  good control without change disc low sodium diet no change in med -px for mail order Her updated medication list for this problem includes:    Norvasc 5 Mg Tabs (Amlodipine besylate) .Marland Kitchen... Take 1 tablet by mouth once a day  BP today: 118/64 Prior BP: 120/60 (11/22/2008)  Prior 10 Yr Risk Heart Disease: 20 % (11/22/2008)  Labs Reviewed: K+: 4.4 (04/03/2009) Creat: : 0.8 (04/03/2009)   Chol: 178 (04/03/2009)   HDL: 44.60 (04/03/2009)   LDL: 105 (04/03/2009)   TG: 142.0 (04/03/2009)  Problem # 2:  HYPERCHOLESTEROLEMIA, PURE (ICD-272.0) Assessment: Unchanged  good control with pravachol and diet  no change  f/u 6 mo  rev labs and low sat fat diet today Her updated medication list for this problem includes:    Pravachol 40 Mg Tabs (Pravastatin sodium) .Marland Kitchen... Take 1 tablet by mouth once a day  Labs Reviewed: SGOT: 16 (04/03/2009)   SGPT: 9 (04/03/2009)  Lipid Goals: Chol Goal: 200 (11/22/2008)   HDL Goal: 40 (11/22/2008)   LDL Goal: 130 (11/22/2008)   TG Goal: 150 (11/22/2008)  Prior 10 Yr Risk Heart Disease: 20 % (11/22/2008)   HDL:44.60 (04/03/2009), 27.20 (11/22/2008)  LDL:105 (04/03/2009), 88 (11/22/2008)  Chol:178 (04/03/2009), 152 (11/22/2008)  Trig:142.0  (04/03/2009), 186.0 (11/22/2008)  Problem # 3:  LEFT SIDED ULCERATIVE COLITIS (ICD-556.5) Assessment: Comment Only symptom free and anemia is resolved continue GI f/u and asacol  Problem # 4:  ANEMIA (ICD-285.9) Assessment: Improved  resolved with tx of GI problems and low dose iron daily  will continue to follow  The following medications were removed from the medication list:    Ferrous Sulfate 325 (65 Fe) Mg Tabs (Ferrous sulfate) .Marland Kitchen... Take 1 tablet by mouth once a day  Hgb: 12.5 (04/03/2009)   Hct: 38.6 (04/03/2009)   Platelets: 321.0 (04/03/2009) RBC: 4.76 (04/03/2009)   RDW: 16.8 (04/03/2009)   WBC: 4.8 (04/03/2009) MCV: 81.0 (04/03/2009)   MCHC: 32.4 (04/03/2009) Ferritin: 24.9 (04/03/2009) TSH: 1.54 (11/22/2008)  Problem # 5:  ALLERGIC RHINITIS (ICD-477.9) Assessment: Deteriorated  with rhinorrhea adv to try claritin otc and update disc allergen avoidance if possible  Her updated medication list for this problem includes:    Claritin 10 Mg Tabs (Loratadine) .Marland Kitchen... 1 by mouth once daily as needed allergies  Complete Medication List: 1)  Norvasc 5 Mg Tabs (Amlodipine besylate) .... Take 1 tablet by mouth once a day 2)  Pravachol 40 Mg Tabs (Pravastatin sodium) .... Take 1 tablet by mouth once a day 3)  Asacol 400 Mg Tbec (Mesalamine) .... 3  by mouth two times a day 4)  Iron Pill Otc Unsure of Mg  .... Otc as directed. 5)  Claritin 10 Mg Tabs (Loratadine) .Marland Kitchen.. 1 by mouth once daily as needed allergies  Patient Instructions: 1)  you can try claritin over the counter 10 mg daily for runny nose  2)  stay as active as possibe and eat a balanced diet  3)  follow up with me in 6 months or earlier if needed  Prescriptions: PRAVACHOL 40 MG  TABS (PRAVASTATIN SODIUM) Take 1 tablet by mouth once a day  #90 x 3   Entered and Authorized by:   Allena Earing MD   Signed by:   Allena Earing MD on 04/12/2009   Method used:   Print then Give to Patient   RxID:    [id number] NORVASC 5 MG  TABS (AMLODIPINE BESYLATE) Take 1 tablet by mouth once a day  #90 x 3   Entered and Authorized by:   Allena Earing MD   Signed by:   Allena Earing MD on 04/12/2009   Method used:   Print then Give to Patient   RxID:   [id number]   Current Allergies (reviewed today): ! ACE INHIBITORS ! LIPITOR ! * TENEX ! * VYTORIN ! * HCTZ ! * MIAICALCIN ! * RHINACORT AQUA ! * DETROL LA

## 2010-03-27 NOTE — Progress Notes (Signed)
Summary: Refill Asacol   Phone Note Call from Patient Call back at Home Phone [phone]   Caller: Patient Call For: Dr. Carlean Purl Reason for Call: Refill Medication Summary of Call: Needs a new prescription for Asacol sent to [facility] Fax [phone] Initial call taken by: Webb Laws,  October 31, 2009 9:52 AM  Follow-up for Phone Call        LM to [facility] at home number Abelino Derrick CMA Deborra Medina)  October 31, 2009 11:54 AM  I spoke to [place], pt's daughter, and we scheduled pt an appointment for 12/07/09.  i will send refill.  Jacqlyn Larsen states that as far as she knows her mother is taking 3 tabs in AM and 3 tabs in PM. Follow-up by: Abelino Derrick CMA Deborra Medina),  October 31, 2009 4:52 PM    Prescriptions: ASACOL 400 MG  TBEC (MESALAMINE) 3  by mouth two times a day  #540 x 0   Entered by:   Abelino Derrick CMA ([place])   Authorized by:   Gatha Mayer MD, [facility]   Signed by:   Abelino Derrick CMA ([place]) on 10/31/2009   Method used:   Electronically to        [facility] (mail-order)       [place]  [address]       Ph: [phone]       Fax: [phone]   RxID:   [id number]

## 2010-03-27 NOTE — Miscellaneous (Signed)
Summary: Med list update Cyanocobalamin  Medications Added CYANOCOBALAMIN 1000 MCG/ML SOLN (CYANOCOBALAMIN) Vit B12 injection weekly for 4 weeks then f/u with Dr Glori Bickers for appt and lab.       Clinical Lists Changes  Medications: Added new medication of CYANOCOBALAMIN 1000 MCG/ML SOLN (CYANOCOBALAMIN) Vit B12 injection weekly for 4 weeks then f/u with Dr Glori Bickers for appt and lab.     Current Allergies: ! ACE INHIBITORS ! LIPITOR ! * TENEX ! * VYTORIN ! * HCTZ ! * MIAICALCIN ! * RHINACORT AQUA ! * DETROL LA

## 2010-03-27 NOTE — Assessment & Plan Note (Signed)
Summary: FOLLOW UP AFTER B12 INJECTIONS/RI   Vital Signs:  Patient profile:   77 year old female Height:      62 inches Weight:      136.75 pounds BMI:     25.10 Temp:     98 degrees F oral Pulse rate:   64 / minute Pulse rhythm:   regular BP sitting:   152 / 74  (left arm) Cuff size:   regular  Vitals Entered By: Ozzie Hoyle LPN (November 09, 3084 11:47 AM) CC: F/u after B12 injections   History of Present Illness: is here for f/u of B12 def labs from 8/15 showed low level of 146-with nl folate  ? if addding to memory problem  got 4 b12 shots in 4 weeks  for re check today has remote hx of gastritis no PPI  also vit D slt low at 27 told to inc dose to 4000 international units daily otc   has a horrible cold  mostly a cough  since the first of the week - runny nose (has chloracedin hbp )  a deep cough  not spitting anything out  no fever or chills   does not think her B12 has changed her symptoms -- family does not notice much   her daughter is a nurse who could give her a b12 shots if needed   Allergies: 1)  ! Ace Inhibitors 2)  ! Lipitor 3)  ! * Tenex 4)  ! * Vytorin 5)  ! * Hctz 6)  ! * Miaicalcin 7)  ! * Rhinacort Aqua 8)  ! * Detrol La  Past History:  Past Medical History: Last updated: 11/22/2008 Allergic rhinitis Diverticulosis, colon Hypertension Osteoarthritis Osteoporosis Left-sided ulcerative colitis 12/08 gastritis anemia ankle edema  macular degeneration- is legally blind  overactive bladder   Past Surgical History: Last updated: 06/30/2008 Lumbar disk disease Dexa- LS osteoporosis, left hip osteopenia (12/1997) Admit- lowe GI bleed (09/2002) Abn pap- ulcer, benign Colonoscopy- severe diverticulosis, polyp (10/2002), 12/08 = L UC, 8/04--colitis, diverticulosis, hemorrhoids EGD--10/11/02--hiatal hernia, gastritis--Stark Cataract extraction- left (09/2003) Deg disk- back 12/08 hosp colitis, gastritis, uti  Family  History: Last updated: 02/09/2008 Father:  Mother:  Siblings: brother- colon cancer no family hx of colitis  Social History: Last updated: 02/09/2008 Marital Status: Children: 2 girls, 2 boys Occupation: retired non smoker Alcohol Use - no Daily Caffeine Use-ice tea Illicit Drug Use - no Patient does not get regular exercise.   Risk Factors: Exercise: no (02/09/2008)  Risk Factors: Smoking Status: never (03/05/2007)  Review of Systems General:  Complains of fatigue; denies loss of appetite and malaise. Eyes:  Denies blurring and eye irritation. ENT:  Complains of decreased hearing, postnasal drainage, sinus pressure, and sore throat; denies ear discharge and earache. CV:  Denies chest pain or discomfort, lightheadness, and palpitations. Resp:  Denies cough and wheezing. GI:  Denies abdominal pain, change in bowel habits, and indigestion. GU:  Denies dysuria. MS:  Complains of stiffness; denies cramps. Derm:  Denies itching, lesion(s), poor wound healing, and rash. Neuro:  Complains of memory loss; denies headaches, numbness, and tingling. Endo:  Denies cold intolerance, excessive thirst, excessive urination, and heat intolerance. Heme:  Denies abnormal bruising and bleeding.  Physical Exam  General:  somewhat frail appearing elderly female Head:  normocephalic, atraumatic, and no abnormalities observed.   Eyes:  pupils equal, pupils round, and pupils reactive to light.  vision is baseline poor Ears:  R ear normal and L ear normal.  Nose:  nares are injected and congested bilaterally  Mouth:  pharynx pink and moist.   Neck:  No deformities, masses, or tenderness noted. Lungs:  CTA with harsh and distant bs at bases  no rales/rhonchi or wheeze   Heart:  Normal rate and regular rhythm. S1 and S2 normal without gallop, murmur, click, rub or other extra sounds. Abdomen:  Bowel sounds positive,abdomen soft and non-tender without masses, organomegaly or hernias noted. no  renal bruits  Msk:  No deformity or scoliosis noted of thoracic or lumbar spine.  no acute joint changes  Pulses:  R and L carotid,radial,femoral,dorsalis pedis and posterior tibial pulses are full and equal bilaterally Extremities:  No clubbing, cyanosis, edema, or deformity noted with normal full range of motion of all joints.   Neurologic:  sensation intact to light touch, gait normal, and DTRs symmetrical and normal.   Skin:  Intact without suspicious lesions or rashes no pallor  Cervical Nodes:  No lymphadenopathy noted Inguinal Nodes:  No significant adenopathy Psych:  talkative and somewhat forgetful   Impression & Recommendations:  Problem # 1:  URI (ICD-465.9) Assessment New viral with some chest congestion in elderly female at risk of pneumonia  will cover with zithromax chlorcedin hpb as needed  fluids/ rest pt advised to update me if symptoms worsen or do not improve - esp if fever or worse cough Her updated medication list for this problem includes:    Claritin 10 Mg Tabs (Loratadine) .Marland Kitchen... 1 by mouth once daily as needed allergies  Problem # 2:  B12 DEFICIENCY (ICD-266.2) Assessment: New tx with 4 B12 shots in 4 days may not have good GI absorbtion in light of past gastritis and age  check level and adv further Orders: Venipuncture (99371) TLB-B12, Serum-Total ONLY (69678-[id number])  Complete Medication List: 1)  Norvasc 5 Mg Tabs (Amlodipine besylate) .... Take 1 tablet by mouth once a day 2)  Pravachol 40 Mg Tabs (Pravastatin sodium) .... Take 1 tablet by mouth once a day 3)  Asacol 400 Mg Tbec (Mesalamine) .... 3  by mouth two times a day 4)  Iron Pill Otc Unsure of Mg  .... Otc as directed. 5)  Claritin 10 Mg Tabs (Loratadine) .Marland Kitchen.. 1 by mouth once daily as needed allergies 6)  Vitamin D 2000 Unit Tabs (Cholecalciferol) .... Take 1 tablet by mouth two times a day 7)  Cyanocobalamin 1000 Mcg/ml Soln (Cyanocobalamin) .... Vit b12 injection weekly for 4 weeks then f/u  with dr Lakita Sahlin for appt and lab. 8)  Zithromax Z-pak 250 Mg Tabs (Azithromycin) .... Take by mouth as directed  Patient Instructions: 1)  labs for B12 level today- then I will advise your further  2)  take the zithromax for respiratory infection as directed 3)  drink lots of water  4)  update me if symptoms get worse 5)  chlorcedin is fine Prescriptions: ZITHROMAX Z-PAK 250 MG TABS (AZITHROMYCIN) take by mouth as directed  #1 pack x 0   Entered and Authorized by:   Allena Earing MD   Signed by:   Allena Earing MD on 11/09/2009   Method used:   [facility] [id number]       [address]       [place], [place]  [address]       Ph: [phone]       Fax: [phone]  RxID:   [id number]   Current Allergies (reviewed today): ! ACE INHIBITORS ! LIPITOR ! * TENEX ! * VYTORIN ! * HCTZ ! * MIAICALCIN ! * RHINACORT AQUA ! * DETROL LA

## 2010-03-27 NOTE — Letter (Signed)
Summary: Handicapped Placard/[facility]  Handicapped Placard/[facility]   Imported By: Edmonia James 10/26/2009 12:27:02  _____________________________________________________________________  External Attachment:    Type:   Image     Comment:   External Document

## 2010-03-27 NOTE — Progress Notes (Signed)
Summary: Cyanocobalamin on back order  Phone Note From Pharmacy   [id number] Call For: Dr. Glori Bickers  Summary of Call: Received fax from pharmacy stating that Cyanocobalamin is on back order and the patient needs a different medication.  Please advise. Initial call taken by: Sherrian Divers CMA Deborra Medina),  December 04, 2009 8:55 AM  Follow-up for Phone Call        please ask the pharmacist what the alternatives are- thanks Follow-up by: Allena Earing MD,  December 04, 2009 9:55 AM  Additional Follow-up for Phone Call Additional follow up Details #1::        Spoke with pharmacist at [facility] and Injectable B12 is on long term back order possibly Jan.  Pharmacist said no good substitute. Could only recommend taking B12 orally OTC.Ozzie Hoyle LPN  December 04, 7652 12:54 PM   that is great!- I will do px for call in Additional Follow-up by: Allena Earing MD,  December 06, 2009 5:19 PM    Additional Follow-up for Phone Call Additional follow up Details #2::    I still want her to have the monthly shots -- hopefully could get them here unless we run out too  please see if this is feasable with her age and mobility issues otherwise will have to do higher dose oral replacement- i'm worried that will not work as well  let me know  Follow-up by: Allena Earing MD,  December 04, 2009 1:27 PM  Additional Follow-up for Phone Call Additional follow up Details #3:: Details for Additional Follow-up Action Taken: I spoke with Jacqlyn Larsen Pt's daughter and she would like for pt to get B12 injection at home. I called [facility] and Rob has the Vit B12 injectable there. Jacqlyn Larsen said she would be glad to pick up at [facility] if Dr Glori Bickers thinks that is a good idea. Jacqlyn Larsen would like call back by end of week re: if med called to [facility].Ozzie Hoyle LPN  December 06, 6501 5:06 PM   Medication phoned to [facility] as instructed. Patient's daughter notified as instructed by telephone. Jacqlyn Larsen will  call [facility] and let them know she is getting at another pharmacy.Ozzie Hoyle LPN  December 08, 5463 10:58 AM   Prescriptions: CYANOCOBALAMIN 1000 MCG/ML SOLN (CYANOCOBALAMIN) inject 1 ML as directed once monthly  #3 months x 3   Entered and Authorized by:   Allena Earing MD   Signed by:   Ozzie Hoyle LPN on 68/01/7516   Method used:   Telephoned to ...       [id number]       [address]       [place], [place]  [address]       Ph: [phone]       Fax: [phone]   RxID:   [id number]

## 2010-03-27 NOTE — Progress Notes (Signed)
Summary: still has cough  Phone Note Call from Patient   Caller: Daughter Summary of Call: Pt was seen last week for URI, given z-pack, which she finished on sunday.  Still has cough and daughter is asking what she can take.  Advised otc plain robitussin or generic mucinex- with plenty of fluids.  Advised her cough can linger for awhile. Initial call taken by: Marty Heck CMA,  November 14, 2009 11:52 AM  Follow-up for Phone Call        agree with above  if worse / production/ fever or wheezing update me -- House need f/u or x ray  is nl for cough to persist for a while, however Follow-up by: Allena Earing MD,  November 14, 2009 1:34 PM  Additional Follow-up for Phone Call Additional follow up Details #1::        Left message on machine for patient to call back. Emelia Salisbury LPN  November 14, 7941 3:04 PM  Left message for patient's daughter Ashley House to call back. Ozzie Hoyle LPN  November 15, 2759 4:52 PM      Additional Follow-up for Phone Call Additional follow up Details #2::    Spoke with pt's daughter and advised her.  She will call back if problems.    Marty Heck CMA  November 15, 2009 9:19 AM

## 2010-03-27 NOTE — Progress Notes (Signed)
Summary: Decided not to try Aricept  Phone Note Call from Patient Call back at [phone]   Caller: Pt's daughter Call For: Allena Earing MD Summary of Call: Pt's daughter called and family decided not to try pt on Aricept due to chance Aricept can bother digestive system (diarrhea). Becky wanted to let Dr Glori Bickers know. Jacqlyn Larsen will call pharmacy and let them know she is not picking up med. Initial call taken by: Ozzie Hoyle LPN,  November 24, 1280 9:31 AM  Follow-up for Phone Call        thanks for the update - will take off the list Follow-up by: Allena Earing MD,  November 24, 2009 10:03 AM  Additional Follow-up for Phone Call Additional follow up Details #1::        Thank you.Ozzie Hoyle LPN  November 25, 811 11:17 AM      Current Allergies: ! ACE INHIBITORS ! LIPITOR ! * TENEX ! * VYTORIN ! * HCTZ ! * MIAICALCIN ! * RHINACORT AQUA ! * DETROL LA

## 2010-03-27 NOTE — Assessment & Plan Note (Signed)
Summary: FOLLOW UP UC//SP    History of Present Illness Visit Type: Follow-up Visit Primary GI MD: Silvano Rusk MD [facility] Primary Provider: Loura Pardon, MD Chief Complaint: UC follow up. History of Present Illness:   Patient states that she is doing good. Here for follow up of her UC and to get a medication refill on Asacol.  doing well overall considering age but macular degeneration.  Aricept being considered but daughter not going to due to possible diarrhea and unclear efficacy  Occasional urge incontinence    GI Review of Systems      Denies abdominal pain, acid reflux, belching, bloating, chest pain, dysphagia with liquids, dysphagia with solids, heartburn, loss of appetite, nausea, vomiting, vomiting blood, weight loss, and  weight gain.        Denies anal fissure, black tarry stools, change in bowel habit, constipation, diarrhea, diverticulosis, fecal incontinence, heme positive stool, hemorrhoids, irritable bowel syndrome, jaundice, light color stool, liver problems, rectal bleeding, and  rectal pain.    Current Medications (verified): 1)  Norvasc 5 Mg  Tabs (Amlodipine Besylate) .... Take 1 Tablet By Mouth Once A Day 2)  Pravachol 40 Mg  Tabs (Pravastatin Sodium) .... Take 1 Tablet By Mouth Once A Day 3)  Asacol 400 Mg  Tbec (Mesalamine) .... 3  By Mouth Two Times A Day 4)  Vitamin E Skin 4000 Unit Crea (Vitamin E-Vit  A & D) .... Take One By Mouth Once Daily 5)  Cyanocobalamin 1000 Mcg/ml Soln (Cyanocobalamin) .... Inject 1 Ml As Directed Once Monthly 6)  Syringe and Needle .... To Inject 1 Ml of Vitamin B12 Monthly  Allergies (verified): 1)  ! Ace Inhibitors 2)  ! Lipitor 3)  ! * Tenex 4)  ! * Vytorin 5)  ! * Hctz 6)  ! * Miaicalcin 7)  ! * Rhinacort Aqua 8)  ! * Detrol La  Past History:  Past Medical History: Reviewed history from 11/22/2008 and no changes required. Allergic rhinitis Diverticulosis,  colon Hypertension Osteoarthritis Osteoporosis Left-sided ulcerative colitis 12/08 gastritis anemia ankle edema  macular degeneration- is legally blind  overactive bladder   Past Surgical History: Reviewed history from 06/30/2008 and no changes required. Lumbar disk disease Dexa- LS osteoporosis, left hip osteopenia (12/1997) Admit- lowe GI bleed (09/2002) Abn pap- ulcer, benign Colonoscopy- severe diverticulosis, polyp (10/2002), 12/08 = L UC, 8/04--colitis, diverticulosis, hemorrhoids EGD--10/11/02--hiatal hernia, gastritis--Stark Cataract extraction- left (09/2003) Deg disk- back 12/08 hosp colitis, gastritis, uti  Family History: Siblings: brother- colon cancer dxed in 60s no family hx of colitis Family History of Kidney Disease: mother  Social History: Marital Status: Children: 2 girls, 2 boys Occupation: retired non smoker Alcohol Use - no Daily Caffeine Use-ice tea 3 x day, coffee one cup per day Illicit Drug Use - no Patient does not get regular exercise.   Vital Signs:  Patient profile:   77 year old female Height:      62 inches Weight:      134.0 pounds BMI:     24.60 Pulse rate:   66 / minute Pulse rhythm:   regular BP sitting:   140 / 60  (left arm) Cuff size:   regular  Vitals Entered By: Bernita Buffy CMA Deborra Medina) (December 07, 2009 11:49 AM)  Physical Exam  General:  Well developed, well nourished, no acute distress. Abdomen:  soft and nontender   Impression & Recommendations:  Problem # 1:  ULCERATIVE COLITIS-LEFT SIDE (ICD-556.5) Assessment Unchanged dx 12/08 colonoscopy refill Asacol re-explained  need for chronic therapy see if Dr. Glori Bickers will refill in future as coming here is difficult and as long as no sig sxs PCP could refill  Problem # 2:  FULL INCONTINENCE OF FECES (ICD-787.60) Assessment: New a few times a year do not think it is UC-related and no Rx sensible as rare and unpredictable likely related to mobility, visual loss  and difficulty reaching bathroom  Patient Instructions: 1)  Please continue current medications.  2)  We have sent a refill on your Asacol to [facility].  This has been refilled for one year. 3)  You may get future refills from Dr. Glori Bickers. 4)  Please schedule a follow-up appointment as needed.  5)  The medication list was reviewed and reconciled.  All changed / newly prescribed medications were explained.  A complete medication list was provided to the patient / caregiver. Prescriptions: ASACOL 400 MG  TBEC (MESALAMINE) 3  by mouth two times a day  #540 x 3   Entered by:   Abelino Derrick CMA ([place])   Authorized by:   Gatha Mayer MD, [facility]   Signed by:   Abelino Derrick CMA ([place]) on 12/07/2009   Method used:   Faxed to ...       [facility])       [address] [place], [place]  [id number]       Ph: [phone]       Fax: [phone]   RxID:   [id number]

## 2010-03-27 NOTE — Assessment & Plan Note (Signed)
Summary: VITAMIN B 12 INJECTION;RI   Nurse Visit   Allergies: 1)  ! Ace Inhibitors 2)  ! Lipitor 3)  ! * Tenex 4)  ! * Vytorin 5)  ! * Hctz 6)  ! * Miaicalcin 7)  ! * Rhinacort Aqua 8)  ! * Detrol La  Medication Administration  Injection # 1:    Medication: Vit B12 1000 mcg    Diagnosis: ANEMIA (CLE-751.9)    Route: IM    Site: R deltoid    Exp Date: 01/26/2011    Lot #: [id number]    Mfr: Willard    Patient tolerated injection without complications    Given by: Emelia Salisbury LPN (November 02, 7492 10:21 AM)  Orders Added: 1)  Vit B12 1000 mcg [J3420] 2)  Admin of Therapeutic Inj  intramuscular or subcutaneous [96372]   Medication Administration  Injection # 1:    Medication: Vit B12 1000 mcg    Diagnosis: ANEMIA (WHQ-759.9)    Route: IM    Site: R deltoid    Exp Date: 01/26/2011    Lot #: [id number]    Mfr: American Regent    Patient tolerated injection without complications    Given by: Emelia Salisbury LPN (November 03, 4663 10:21 AM)  Orders Added: 1)  Vit B12 1000 mcg [J3420] 2)  Admin of Therapeutic Inj  intramuscular or subcutaneous [[id number]]

## 2010-03-27 NOTE — Assessment & Plan Note (Signed)
Summary: VIT B 12 INJECTION PER DR TOWER/RI   Nurse Visit    Medication Administration  Injection # 1:    Medication: Vit B12 1000 mcg    Diagnosis: ANEMIA (VZD-638.9)    Route: IM    Site: R deltoid    Exp Date: 01/26/2011    Lot #: [id number]    Mfr: American Regent    Patient tolerated injection without complications    Given by: Sherrian Divers CMA ([place]) (October 19, 2009 10:14 AM)  Orders Added: 1)  Vit B12 1000 mcg [J3420] 2)  Admin of Therapeutic Inj  intramuscular or subcutaneous [[id number]]

## 2010-04-02 ENCOUNTER — Encounter: Payer: Self-pay | Admitting: Family Medicine

## 2010-04-02 ENCOUNTER — Other Ambulatory Visit: Payer: Self-pay | Admitting: Family Medicine

## 2010-04-02 ENCOUNTER — Encounter (INDEPENDENT_AMBULATORY_CARE_PROVIDER_SITE_OTHER): Payer: Self-pay | Admitting: *Deleted

## 2010-04-02 ENCOUNTER — Ambulatory Visit (INDEPENDENT_AMBULATORY_CARE_PROVIDER_SITE_OTHER): Payer: Self-pay | Admitting: Family Medicine

## 2010-04-02 ENCOUNTER — Other Ambulatory Visit (INDEPENDENT_AMBULATORY_CARE_PROVIDER_SITE_OTHER): Payer: Medicare Other

## 2010-04-02 DIAGNOSIS — D649 Anemia, unspecified: Secondary | ICD-10-CM

## 2010-04-02 DIAGNOSIS — F411 Generalized anxiety disorder: Secondary | ICD-10-CM | POA: Insufficient documentation

## 2010-04-02 DIAGNOSIS — E7800 Pure hypercholesterolemia, unspecified: Secondary | ICD-10-CM

## 2010-04-02 DIAGNOSIS — R413 Other amnesia: Secondary | ICD-10-CM

## 2010-04-02 DIAGNOSIS — E559 Vitamin D deficiency, unspecified: Secondary | ICD-10-CM

## 2010-04-02 DIAGNOSIS — I10 Essential (primary) hypertension: Secondary | ICD-10-CM

## 2010-04-02 DIAGNOSIS — E538 Deficiency of other specified B group vitamins: Secondary | ICD-10-CM

## 2010-04-02 DIAGNOSIS — M810 Age-related osteoporosis without current pathological fracture: Secondary | ICD-10-CM

## 2010-04-02 DIAGNOSIS — E785 Hyperlipidemia, unspecified: Secondary | ICD-10-CM

## 2010-04-02 DIAGNOSIS — K5150 Left sided colitis without complications: Secondary | ICD-10-CM

## 2010-04-02 LAB — LIPID PANEL
Total CHOL/HDL Ratio: 5
VLDL: 48.8 mg/dL — ABNORMAL HIGH (ref 0.0–40.0)

## 2010-04-02 LAB — RENAL FUNCTION PANEL
BUN: 17 mg/dL (ref 6–23)
CO2: 29 mEq/L (ref 19–32)
Calcium: 9.6 mg/dL (ref 8.4–10.5)
Creatinine, Ser: 0.8 mg/dL (ref 0.4–1.2)

## 2010-04-05 LAB — CONVERTED CEMR LAB: Vit D, 25-Hydroxy: 51 ng/mL (ref 30–89)

## 2010-04-12 NOTE — Assessment & Plan Note (Signed)
Summary: depression   Vital Signs:  Patient profile:   77 year old female Height:      62 inches Weight:      134.50 pounds BMI:     24.69 Temp:     98.1 degrees F oral Pulse rate:   76 / minute Pulse rhythm:   regular BP sitting:   122 / 64  (left arm) Cuff size:   regular  Vitals Entered By: Ozzie Hoyle LPN (April 02, 4694 11:41 AM) CC: depression   History of Present Illness: is here to disc depression and also for labs  pt's family called - concerned that this is worse after she lost her vision   continues to forget but declines arcept  she gets very anxious - especially about doctor visits and appts - gets extremely anxious  afraid she will miss appts  wants to go to a rest home because she does not want her family to wait on her  family does help a lot --there is always someone there  overall appears to have a positive affect   this makes her husband very anxious and upset along with other family members    is doing well for her age  can move well and walk on her own   wt is stable and bp good  due for B12 and D and renal /ast/alt / lipids  on statin getting B12 shot monthly  Dr Carlean Purl signed off her case- wants her to stay on asacol indefinitely and needs to be refilled with me  is stable with her ulcerative colitis not planning any more colonoscopies     Allergies: 1)  ! Ace Inhibitors 2)  ! Lipitor 3)  ! * Tenex 4)  ! * Vytorin 5)  ! * Hctz 6)  ! * Miaicalcin 7)  ! * Rhinacort Aqua 8)  ! * Detrol La  Past History:  Past Surgical History: Last updated: 06/30/2008 Lumbar disk disease Dexa- LS osteoporosis, left hip osteopenia (12/1997) Admit- lowe GI bleed (09/2002) Abn pap- ulcer, benign Colonoscopy- severe diverticulosis, polyp (10/2002), 12/08 = L UC, 8/04--colitis, diverticulosis, hemorrhoids EGD--10/11/02--hiatal hernia, gastritis--Stark Cataract extraction- left (09/2003) Deg disk- back 12/08 hosp colitis, gastritis, uti  Family  History: Last updated: 12/07/2009 Siblings: brother- colon cancer dxed in 41s no family hx of colitis Family History of Kidney Disease: mother  Social History: Last updated: 12/07/2009 Marital Status: Children: 2 girls, 2 boys Occupation: retired non smoker Alcohol Use - no Daily Caffeine Use-ice tea 3 x day, coffee one cup per day Illicit Drug Use - no Patient does not get regular exercise.   Risk Factors: Exercise: no (02/09/2008)  Risk Factors: Smoking Status: never (03/05/2007)  Past Medical History: Allergic rhinitis Diverticulosis, colon Hypertension Osteoarthritis Osteoporosis Left-sided ulcerative colitis 12/08 -- Dr Carlean Purl signed off/ I px med  gastritis anemia ankle edema  macular degeneration- is legally blind  overactive bladder  hemorroids -- unable to have surgery or proceedures due ot age /presentation    GI- Dr Carlean Purl   Review of Systems General:  Complains of fatigue; denies loss of appetite and malaise. Eyes:  Denies blurring and eye irritation. CV:  Denies chest pain or discomfort, lightheadness, and palpitations. Resp:  Denies cough, pleuritic, shortness of breath, and wheezing. GI:  Denies abdominal pain, change in bowel habits, indigestion, and nausea. GU:  Denies dysuria and urinary frequency. MS:  Complains of joint pain and stiffness; denies joint redness and joint swelling. Derm:  Denies itching, lesion(s),  poor wound healing, and rash. Neuro:  Complains of memory loss; denies headaches, numbness, and tingling. Psych:  Complains of anxiety, depression, easily tearful, and irritability; denies sense of great danger and suicidal thoughts/plans. Endo:  Denies cold intolerance, excessive thirst, excessive urination, and heat intolerance. Heme:  Denies abnormal bruising and bleeding.  Physical Exam  General:  somewhat frail appearing elderly female Head:  normocephalic, atraumatic, and no abnormalities observed.   Eyes:  pupils equal,  pupils round, and pupils reactive to light.  vision is poor baseline  Ears:  R ear normal and L ear normal.   Nose:  no nasal discharge.   Mouth:  pharynx pink and moist.   Neck:  supple with full rom and no masses or thyromegally, no JVD or carotid bruit  Chest Wall:  No deformities, masses, or tenderness noted. Lungs:  CTA with harsh and distant bs at bases  no rales/rhonchi or wheeze   Heart:  Normal rate and regular rhythm. S1 and S2 normal without gallop, murmur, click, rub or other extra sounds. Abdomen:  soft and non-tender.   Msk:  No deformity or scoliosis noted of thoracic or lumbar spine.  no acute joint changes  Pulses:  R and L carotid,radial,femoral,dorsalis pedis and posterior tibial pulses are full and equal bilaterally Extremities:  No clubbing, cyanosis, edema, or deformity noted with normal full range of motion of all joints.   Neurologic:  sensation intact to light touch and gait normal.  no tremor  Skin:  Intact without suspicious lesions or rashes Cervical Nodes:  No lymphadenopathy noted Inguinal Nodes:  No significant adenopathy Psych:  pt seems mentally clear but gets very angry at her family when we bring up issues of anxiety or memory is defiant and defensive not confused no SI   Impression & Recommendations:  Problem # 1:  MEMORY LOSS (ICD-780.93) Assessment Unchanged we disc the poss of anx/depression pt wholehardedly denied  that  it's obvious her family wants her to be treated and thinks she has anx problem that may be worseing the memory  pt declines tx even if it helps them she got angry and defensive so no other mention was made  she plans to have a family discussion and thinks that if they are "burdened" by her -- she would much rather go to a rest home than take ANY medication for memory or anx or depression spent 25 minutes face to face time with pt , over 50% of which was spent on counseling and coordination of care   pt basically refulse any  treatment whatsoever but we discussed it in detail anyway  Problem # 2:  ANXIETY DISORDER (ICD-300.00) Assessment: New worsening with age see above  per pt's family - she does sound anxious   Problem # 3:  ULCERATIVE COLITIS-LEFT SIDE (ICD-556.5) Assessment: Unchanged I am now px her meds for this it is stable  no longer going to GI no more colonosc planned  will send her back if any symptoms  med / asacol refilled   Problem # 4:  B12 DEFICIENCY (ICD-266.2) Assessment: Unchanged lab today continue monthly shots  Problem # 5:  HYPERTENSION (ICD-401.9) Assessment: Unchanged  good control  no change in med was refilled  Her updated medication list for this problem includes:    Norvasc 5 Mg Tabs (Amlodipine besylate) .Marland Kitchen... Take 1 tablet by mouth once a day  BP today: 122/64 Prior BP: 140/60 (12/07/2009)  Prior 10 Yr Risk Heart Disease: 20 % (11/22/2008)  Labs Reviewed: K+: 5.0 (10/09/2009) Creat: : 0.7 (10/09/2009)   Chol: 184 (10/09/2009)   HDL: 41.30 (10/09/2009)   LDL: 105 (04/03/2009)   TG: 216.0 (10/09/2009)  Problem # 6:  HYPERCHOLESTEROLEMIA, PURE (ICD-272.0) Assessment: Unchanged  due for lab today has been well controlled on statin and diet  Her updated medication list for this problem includes:    Pravachol 40 Mg Tabs (Pravastatin sodium) .Marland Kitchen... Take 1 tablet by mouth once a day  Labs Reviewed: SGOT: 15 (10/09/2009)   SGPT: 9 (10/09/2009)  Lipid Goals: Chol Goal: 200 (11/22/2008)   HDL Goal: 40 (11/22/2008)   LDL Goal: 130 (11/22/2008)   TG Goal: 150 (11/22/2008)  Prior 10 Yr Risk Heart Disease: 20 % (11/22/2008)   HDL:41.30 (10/09/2009), 44.60 (04/03/2009)  LDL:105 (04/03/2009), 88 (11/22/2008)  Chol:184 (10/09/2009), 178 (04/03/2009)  Trig:216.0 (10/09/2009), 142.0 (04/03/2009)  Complete Medication List: 1)  Norvasc 5 Mg Tabs (Amlodipine besylate) .... Take 1 tablet by mouth once a day 2)  Pravachol 40 Mg Tabs (Pravastatin sodium) .... Take 1 tablet  by mouth once a day 3)  Asacol 400 Mg Tbec (Mesalamine) .... 3  by mouth two times a day 4)  Cyanocobalamin 1000 Mcg/ml Soln (Cyanocobalamin) .... Inject 1 ml as directed once monthly 5)  Syringe and Needle  .... To inject 1 ml of vitamin b12 monthly 6)  Vitamin D 1000 Unit Tabs (Cholecalciferol) .... Two tablets by mouth daily  Patient Instructions: 1)  labs today as planned  2)  let me know if memory problems worsen  Prescriptions: ASACOL 400 MG  TBEC (MESALAMINE) 3  by mouth two times a day  #540 x 3   Entered and Authorized by:   Allena Earing MD   Signed by:   Allena Earing MD on 04/02/2010   Method used:   Print then Give to Patient   RxID:   [id number] PRAVACHOL 40 MG  TABS (PRAVASTATIN SODIUM) Take 1 tablet by mouth once a day  #90 x 3   Entered and Authorized by:   Allena Earing MD   Signed by:   Allena Earing MD on 04/02/2010   Method used:   Print then Give to Patient   RxID:   [id number] NORVASC 5 MG  TABS (AMLODIPINE BESYLATE) Take 1 tablet by mouth once a day  #90 x 3   Entered and Authorized by:   Allena Earing MD   Signed by:   Allena Earing MD on 04/02/2010   Method used:   Print then Give to Patient   RxID:   [id number]    Orders Added: 1)  Est. Patient Level IV [88416]    Current Allergies (reviewed today): ! ACE INHIBITORS ! LIPITOR ! * TENEX ! * VYTORIN ! * HCTZ ! * MIAICALCIN ! * RHINACORT AQUA ! * DETROL LA

## 2010-07-10 NOTE — Assessment & Plan Note (Signed)
[facility] OFFICE NOTE   NAME:Ashley House, Ashley House                      MRN:          [id number]  DATE:03/19/2007                            DOB:          11/02/1918    CHIEF COMPLAINT:  Follow up of hospitalization and diagnosis of colitis.   This is an 78 year old African-American woman that I had seen a number  of years ago.  She had a GI bleed thought due to diverticulosis.  She  was admitted to the hospital again with bleeding problems and Dr. Fuller Plan  performed a colonoscopy which demonstrated left-sided ulcerative colitis  both endoscopically and on pathology.  She is doing well now on Asacol 4  tablets t.i.d.  There are no symptoms with respect to her colitis.  She  does have problems with hemorrhoids at this time.  They are protruding  and creams and ointments have not helped.  She is also complaining of  dysuria in the sense of bladder fullness.  Her past medical history was  reviewed and unchanged.   Medications are listed and reviewed in the chart, as well as the EMR  which I have looked at but not entered.   Colonoscopy was on February 13, 2007.  It demonstrated changes of  colitis from the splenic flexure to the rectum, as well as  diverticulosis and internal and external hemorrhoids.  She was also on  mesalamine enemas for 2 weeks.   PHYSICAL EXAMINATION:  Shows an elderly black woman in no acute  distress.  Weight 126 pounds, blood pressure 118/64, pulse 80.  ABDOMEN:  Soft and nontender, without organomegaly or mass.  In the  presence of female nursing staff, inspection of the rectum shows large,  protruding external hemorrhoids.   ASSESSMENT:  1. Left-sided ulcerative colitis under good control.  2. She had an anemia related to this as well.  3. Dysuria.  4. Protruding external hemorrhoids.   PLAN:  1. Continue current medications.  I will see her back in a month or so      and maybe try  to pull back on this high-dose Asacol as that was her      initial dose and she may not need so much.  However there seems to      be a little bit of confusion with her taking her medications.  She      has some supervision from her family but not consistently, and      though she is alert and oriented and doing well for 88, I am      somewhat concerned about compliance.  2. Given the large hemorrhoids we can ask for a surgical consultation      or re any local therapy like injection, etc.  I doubt she would be      a surgical candidate.  3. A urinalysis was checked and this is abnormal.  We have faxed that      to Dr. Marliss Coots office and a culture will be checked as well.  She      had 100 mg/dl protein.  She  had trace ketones, moderate blood,      large amount of leukocyte esterase, positive nitrate, too numerous      to      count white cells, and 3-5 red cells.  She had a UTI when she was      hospitalized as well and this was treated.  I will defer to Dr.      Glori Bickers regarding treatment of this.     Gatha Mayer, MD,FACG  Electronically Signed    CEG/MedQ  DD: 03/22/2007  DT: 03/22/2007  Job #: [id number]   cc:   Marne A. Glori Bickers, MD

## 2010-07-10 NOTE — Discharge Summary (Signed)
Ashley House, HAFFEY NO.:  [id number]   MEDICAL RECORD NO.:  [id number]          PATIENT TYPE:  INP   LOCATION:  5397                         FACILITY:  [facility]   PHYSICIAN:  Valerie A. Asa Lente, MDDATE OF BIRTH:  28-Apr-1918   DATE OF ADMISSION:  02/12/2007  DATE OF DISCHARGE:  02/16/2007                               DISCHARGE SUMMARY   DISCHARGE DIAGNOSES:  1. Rectal bleeding, multifactorial.  See below.  2. Internal and external hemorrhoids by colonoscopy. Continue Anusol      treatment p.r.n.  3. Left-sided colitis. Pathology of biopsies pending.  Continue      empiric treatment with Asacol and Rowasa enemas.  4. Gastritis. Continue 2-week treatment with triple therapy for      Helicobacter pylori and proton pump inhibitor indefinitely.  5. Positive urinalysis consistent with urinary tract infection.      Continue Cipro therapy for five additional days.  6. Oral candidiasis, improved.  7. Acute blood anemia secondary to #1.  Discharge hemoglobin 9.7,      hemodynamically stable with no further loss.  8. Hypokalemia secondary to gastrointestinal losses and prep, resolved      and normal.  9. Dyslipidemia.  Continue home statin.  10.Hypertension.  Continue home treatment.   DISCHARGE MEDICATIONS:  1,  Mesalamine enema one per rectum nightly x2  weeks.  1. Asacol 400 mg 4 tablets 3 times a day.  2. Prilosec over-the-counter 1 tablet b.i.d. x2 weeks, then daily      indefinitely.  3. Amoxicillin 500 mg 2 tablets b.i.d. x2 weeks.  4. Biaxin 500 mg b.i.d. x2 weeks.  5. Ocuvite multivitamin once daily.  6. Norvasc 5 mg once daily.  7. Pravachol 40 mg nightly.  8. Cipro 500 mg b.i.d. through December 27, dispense 11.  9. Magic mouth wash 5 mL swish and swallow 4 times a day p.r.n. oral      thrush symptoms.   HOSPITAL FOLLOWUP:  With primary care physician, Dr. Loura Pardon,  January 9 at 12 noon.  Also with GI physician, Dr. Silvano Rusk, for  January 22 at  9:45 a.m.   DISPOSITION:  The patient is discharged home in medically stable and  improved condition.   HOSPITAL COURSE BY PROBLEM:  #1.  RECTAL BLEEDING:  The patient is a pleasant, legally blind woman  who was brought to her primary care physician on December 18 due to  rectal bleeding, bright red, as well as tarry dark stools for 3 days  prior to admission. Due to her symptoms despite empiric treatment with  Cipro for a day and one-half, she was admitted for the hospital for  further evaluation.  She has a history of hemorrhoids, and it was  believed this was complicating the above situation.  GI was consulted  for further evaluation.  She was scheduled for an EGD as well as  colonoscopy, monitored with serial hemoglobins.  Procedures took place  on December 19 showing gastritis changes but no ulcers on EGD and colon  with left-sided colitis changes unspecified. She was thus begun on  triple pack therapy  for presumed H. pylori pending CLOtest as well as  anti-inflammatory treatment with mesalamine p.o. as well as enemas.  Biopsy pending at this time, but she has had no further rectal bleeding.  There was also incidentally noted internal and external hemorrhoids  without intervention required. Her hemoglobin remained stable. Clear  diet was advanced  as tolerated.  There being no further acute medical  issues, she was felt stable for discharge home at this time to continue  treatment as noted with outpatient followup with GI and primary care for  further management.   #2.  URINARY TRACT INFECTION:  The patient had a low-grade fever over  the weekend prior to discharge. Her urinalysis did show many bacteria  and positive nitrates, but, unfortunately, no culture was sent. She was  begun on Cipro, and she will be continued on this for a total of 1 week  course of therapy.  She is afebrile and has had no other complication  this hospitalization.      Valerie A. Asa Lente, MD   Electronically Signed     VAL/MEDQ  D:  02/16/2007  T:  02/16/2007  Job:  [id number]

## 2010-07-13 NOTE — Discharge Summary (Signed)
   [name], [name] NO.:  [id number]   MEDICAL RECORD NO.:  [id number]                   PATIENT TYPE:  INP   LOCATION:  5008                                 FACILITY:  [place]   PHYSICIAN:  Gwendolyn Grant, M.D. [facility]          DATE OF BIRTH:  [birth year]   DATE OF ADMISSION:  10/09/2002  DATE OF DISCHARGE:  10/11/2002                                 DISCHARGE SUMMARY   DISCHARGE DIAGNOSES:  1. Blood per rectum.  2. Left sided diverticulosis.   BRIEF ADMISSION HISTORY:  Ms. Ashley House is an 77 year old white female who had  one bloody stool prior to admission. This was painless.   PAST MEDICAL HISTORY:  1. Hypertension.  2. Hypercholesterolemia.  3. Back pain status post recent steroid injection.   HOSPITAL COURSE:  1. Gastrointestinal. The patient presented with probable lower GI bleed,     likely diverticular. The patient remained hemodynamically stable. She was     evaluated by GI and underwent a colonoscopy on October 11, 2002. This     revealed severe diverticulosis which was suspected to be the cause of     bleeding, a single polyp, and a multiple tiny ulcers in the sigmoid,     nonspecific. Dr. Carlean Purl recommended no aspirin or nonsteroidals for one     week. He otherwise felt she was stable for discharge.   DISCHARGE LABORATORY DATA:  Hemoglobin 11. LFTs were normal.   MEDICATIONS AT DISCHARGE:  None.   FOLLOW UP:  She was instructed to follow up with Dr. Glori Bickers as needed.      Helayne Seminole, P.A. [facility]                  Gwendolyn Grant, M.D. [facility]    LC/MEDQ  D:  10/22/2002  T:  10/23/2002  Job:  [id number]   cc:   Marne A. Glori Bickers, M.D. [facility]   Gatha Mayer, M.D. [facility]

## 2010-07-13 NOTE — H&P (Signed)
NAMESYRITA, DOVEL NO.:  [id number]   MEDICAL RECORD NO.:  [id number]                   PATIENT TYPE:  INP   LOCATION:  3382                                 FACILITY:  [place]   PHYSICIAN:  Colon Branch, MD [facility]                 DATE OF BIRTH:  05/29/1918   DATE OF ADMISSION:  10/09/2002  DATE OF DISCHARGE:                                HISTORY & PHYSICAL   PRIMARY CARE PHYSICIAN:  Dr. Alba Cory.   CHIEF COMPLAINT:  Blood per rectum.   HISTORY OF PRESENT ILLNESS:  Mrs. January is an 77 year old white female who  was admitted to [facility] Emergency Room due to a history of blood per  rectum.  The patient states that the first episode happened yesterday.  She  had a bloody bowel movement described as bright red, about one-half to three-  quarters of a cup.  Today, she also had another bloody stool, this time  described as chunks of black blood with a very foul smell.  Other than that,  she denies any other symptoms or problems at present.  She did feel slightly  weak before admission.   PAST MEDICAL HISTORY:  1. Hypertension.  2. High cholesterol.  3. Chronic back pain.  She is status post two local injections in the back.     The last one was a few days ago.  4. She denies any history of diabetes, coronary artery disease, peptic ulcer     disease, or stress test.  She is unsure if she had a C-scope in the past.     If that did happen, it was several years ago.   SOCIAL HISTORY:  She denies any alcohol or tobacco.   FAMILY HISTORY:  A brother has colon cancer.  No history of coronary artery  disease.   REVIEW OF SYSTEMS:  She denies any fever, chills, chest pain, shortness of  breath, or edema.  No nausea, vomiting, abdominal pain, or postprandial  abdominal pain.  No hematuria, dysuria, cough, hemoptysis, or nose bleed.  She has not been taking any NSAIDS or over-the-counter medication in the  last several days.   MEDICATIONS:  1. Norvasc.  2. Aleve but again has not been taking this in a while.  3. Pravachol.   ALLERGIES:  She is allergic to A BLOOD PRESSURE MEDICATION, but she is  unsure of the name.   PHYSICAL EXAMINATION:  GENERAL:  The patient is alert, oriented, and in no  apparent stress.  No pain, nonicteric afebrile, respirations 20.  O2 sat on  room air is 96.  When she is lying down, blood pressure is 160/81, with a  pulse of 81.  When she stands up, it is 138/81, with a pulse of around 100.  NECK:  No JVD.  LUNGS:  Clear to auscultation bilaterally.  CARDIOVASCULAR:  Regular rate and rhythm without murmur.  ABDOMEN:  Nondistended.  Good bowel sounds without any mass or rebound.  There is no tenderness.  There is no organomegaly.  EXTREMITIES:  No edema.  She has good bilateral pedal pulses.  RECTAL:  A digital rectal exam was performed by the ER physician, and he  found bright, red blood in the rectum.  NEUROLOGIC:  Speech is clear.  Motor exam is symmetric.   LABORATORY & X-RAYS:  She has a hemoglobin of 12.2 with platelets of 274, a  white count of 10.5, a sodium of 138, potassium 3.6, BUN 18, creatinine 0.7.  Blood sugar 118.  PT and PTT are normal.   ASSESSMENT & PLAN:  Mrs. Cherian has been admitted with a lower  gastrointestinal bleed. At this point, she is hemodynamically stable.  I am  planning to admit her to a stepdown unit for close monitoring.  I will  hydrate her gently, and we will consider a transfusion should hemoglobin and  hematocrit drop significantly.  The differential diagnosis for her condition  includes diverticular bleed, cancer, or ischemic colitis.  Based on her  symptoms, this is most likely a diverticular bleed.  This case was discussed  with Dr. __________, who is our GI on call, and she will evaluate her in the  morning unless she becomes unstable.                                                Colon Branch, MD [facility]    JEP/MEDQ  D:  10/09/2002  T:  10/09/2002  Job:   [id number]

## 2010-11-30 LAB — CLOSTRIDIUM DIFFICILE EIA: C difficile Toxins A+B, EIA: NEGATIVE

## 2010-11-30 LAB — BASIC METABOLIC PANEL
Calcium: 8.5
Calcium: 8.9
Chloride: 102
Creatinine, Ser: 0.67
Creatinine, Ser: 0.75
GFR calc Af Amer: >60
GFR calc Af Amer: >60
GFR calc Af Amer: >60
GFR calc non Af Amer: >60
GFR calc non Af Amer: >60
Potassium: 2.9 — ABNORMAL LOW
Sodium: 137
Sodium: 143

## 2010-11-30 LAB — LIPID PANEL
HDL: 17 — ABNORMAL LOW
LDL Cholesterol: 49
Triglycerides: 55
VLDL: 11

## 2010-11-30 LAB — CBC
Hemoglobin: 9.5 — ABNORMAL LOW
MCHC: 33.3
MCV: 76.9 — ABNORMAL LOW
RBC: 3.67 — ABNORMAL LOW
RBC: 3.69 — ABNORMAL LOW
RBC: 3.97
RDW: 14.2
WBC: 9.6
WBC: 9.9

## 2010-11-30 LAB — CULTURE, BLOOD (ROUTINE X 2)
Culture: NO GROWTH
Culture: NO GROWTH

## 2010-11-30 LAB — URINALYSIS, ROUTINE W REFLEX MICROSCOPIC
Ketones, ur: NEGATIVE
Nitrite: POSITIVE — AB
Protein, ur: 30 — AB

## 2010-11-30 LAB — DIFFERENTIAL
Eosinophils Absolute: 0.2
Eosinophils Relative: 2
Lymphocytes Relative: 15
Lymphs Abs: 1.5
Monocytes Absolute: 0.9
Monocytes Relative: 10

## 2010-11-30 LAB — HEMOGLOBIN AND HEMATOCRIT, BLOOD: HCT: 28.3 — ABNORMAL LOW

## 2010-11-30 LAB — ABO/RH: ABO/RH(D): B POS

## 2010-11-30 LAB — HEMOGLOBIN A1C: Hgb A1c MFr Bld: 6.3 — ABNORMAL HIGH

## 2011-04-17 ENCOUNTER — Other Ambulatory Visit: Payer: Self-pay

## 2011-04-17 MED ORDER — MESALAMINE 400 MG PO TBEC: 1200.0000 mg | DELAYED_RELEASE_TABLET | Freq: Two times a day (BID) | ORAL | Status: DC

## 2011-04-17 NOTE — Telephone Encounter (Signed)
Received fax refill request from patient's pharmacy for her Asacol.  The medication was not listed on the patients med list.  Okay to refill?

## 2011-04-17 NOTE — Telephone Encounter (Signed)
Wanted to verify what pharmacy faxed request.

## 2011-04-17 NOTE — Telephone Encounter (Signed)
Px written for call in, no pharmacy is listed  -I agreed to take over refils of this for GI based on her age

## 2011-04-18 MED ORDER — MESALAMINE 400 MG PO TBEC: 1200.0000 mg | DELAYED_RELEASE_TABLET | Freq: Two times a day (BID) | ORAL | Status: DC

## 2011-04-18 NOTE — Telephone Encounter (Signed)
The pharmacy was listed at the top of the message.  [facility] mail service and I entered it in the Meds&orders section as well. Thanks.

## 2011-04-18 NOTE — Telephone Encounter (Signed)
asacol sent electronically as instructed.

## 2011-05-22 ENCOUNTER — Telehealth: Payer: Self-pay | Admitting: Family Medicine

## 2011-05-22 NOTE — Telephone Encounter (Signed)
Spoke with [place] and she stated that patient has a "roaring" sound in her ears but no pain.  Scheduled patient to see Dr. Glori Bickers on 06/07/2011 at 12:15, 30 minute appt.

## 2011-05-22 NOTE — Telephone Encounter (Signed)
Schedule appt whenever agreeable - sat clinic if necessary Not much I can do without seeing her for ears

## 2011-05-22 NOTE — Telephone Encounter (Signed)
Spoke with patient's daughter Jacqlyn Larsen and she stated that she was at the beauty salon and I would need to call her back.  Asked Becky to call me back when she was available.

## 2011-05-22 NOTE — Telephone Encounter (Signed)
Caller: Becky/Child; PCP: Tower, Marne A.; CB#: [phone]; Call regarding Ear Pain; Bil ear pain ringing in her ears x 1 wk, getting angry in the afternoons, unplugging things in the house, walking the floors at night. Advised see in 24 hrs. Jacqlyn Larsen prefers an appt on 05/27/11. She isn't even sure pt will agree to see Dr. Glori Bickers as she does not like going to the doctor. Pls call.

## 2011-06-07 ENCOUNTER — Ambulatory Visit (INDEPENDENT_AMBULATORY_CARE_PROVIDER_SITE_OTHER): Payer: Medicare Other | Admitting: Family Medicine

## 2011-06-07 ENCOUNTER — Encounter: Payer: Self-pay | Admitting: Family Medicine

## 2011-06-07 VITALS — BP 150/74 | HR 72 | Temp 98.0°F | Ht 62.0 in | Wt 122.5 lb

## 2011-06-07 DIAGNOSIS — R413 Other amnesia: Secondary | ICD-10-CM

## 2011-06-07 DIAGNOSIS — I10 Essential (primary) hypertension: Secondary | ICD-10-CM

## 2011-06-07 DIAGNOSIS — Z23 Encounter for immunization: Secondary | ICD-10-CM

## 2011-06-07 DIAGNOSIS — E7800 Pure hypercholesterolemia, unspecified: Secondary | ICD-10-CM

## 2011-06-07 LAB — COMPREHENSIVE METABOLIC PANEL
ALT: 8 U/L (ref 0–35)
AST: 16 U/L (ref 0–37)
Albumin: 4.4 g/dL (ref 3.5–5.2)
BUN: 12 mg/dL (ref 6–23)
Calcium: 9.7 mg/dL (ref 8.4–10.5)
Chloride: 101 mEq/L (ref 96–112)
Potassium: 3.9 mEq/L (ref 3.5–5.1)
Sodium: 141 mEq/L (ref 135–145)
Total Protein: 8 g/dL (ref 6.0–8.3)

## 2011-06-07 LAB — VITAMIN B12: Vitamin B-12: 261 pg/mL (ref 211–911)

## 2011-06-07 LAB — CBC WITH DIFFERENTIAL/PLATELET
Basophils Relative: 0.3 % (ref 0.0–3.0)
Eosinophils Absolute: 0 10*3/uL (ref 0.0–0.7)
Eosinophils Relative: 0.3 % (ref 0.0–5.0)
Lymphocytes Relative: 24.7 % (ref 12.0–46.0)
MCHC: 33.1 g/dL (ref 30.0–36.0)
Neutrophils Relative %: 67.4 % (ref 43.0–77.0)
RBC: 4.41 Mil/uL (ref 3.87–5.11)
WBC: 7.2 10*3/uL (ref 4.5–10.5)

## 2011-06-07 LAB — LIPID PANEL
Cholesterol: 168 mg/dL (ref 0–200)
LDL Cholesterol: 93 mg/dL (ref 0–99)
Total CHOL/HDL Ratio: 4

## 2011-06-07 MED ORDER — TEMAZEPAM 15 MG PO CAPS: 15.0000 mg | ORAL_CAPSULE | Freq: Every evening | ORAL | Status: DC | PRN

## 2011-06-07 MED ORDER — MESALAMINE 400 MG PO TBEC: 1200.0000 mg | DELAYED_RELEASE_TABLET | Freq: Two times a day (BID) | ORAL | Status: DC

## 2011-06-07 MED ORDER — PRAVASTATIN SODIUM 40 MG PO TABS: 40.0000 mg | ORAL_TABLET | Freq: Every day | ORAL | Status: DC

## 2011-06-07 MED ORDER — CYANOCOBALAMIN 1000 MCG/ML IJ SOLN: 1000.0000 ug | INTRAMUSCULAR | Status: DC

## 2011-06-07 MED ORDER — AMLODIPINE BESYLATE 5 MG PO TABS: 5.0000 mg | ORAL_TABLET | Freq: Every day | ORAL | Status: DC

## 2011-06-07 NOTE — Progress Notes (Signed)
Subjective:    Patient ID: Ashley House, female    DOB: 06-Jul-1918, 78 y.o.   MRN: [id number]  HPI Here for f/u after a fall - 1 month ago - still has scab Is doing "pretty good" - for being 7  Thinks her health is good for that age   Had a fall about  Month ago  Got up in the middle of the night - went and got some wood - went to put it in the stove -- hit casing of the door  Hit back of the head -- and a lot of bleeding  Also abraded the back of the leg  Did not c/o of headaches or dizziness Does have some ringing in her ears - had that before   No otc meds   Due for medicine refils   Per pt - sleeps through the night - gets up to urinate  Goes to bed before midnight - and usually sleeps pretty late -- often family has to get her up by lunchtime  Gets up to go to bathroom Monday  States she does not get confused  Does not think she is loosing memory -- but family states otherwise   Last Td? - needs an update   She has macular degeneration Memory loss - she really is not forward about that  Very bad -- does not drive   Last labs over a year ago  Per daughter today (separate interview)  Very worried about her  Sleeps very little at night  Gets up at night- very agitated , walks the floors Gets very angry with husband - her caregiver  Gets angry if family tries to stay with her She gets up at night and wanders  This is affecting father  She firmly believes that pt is incompetent - has not sought legal action - but needs to  Aware she likely needs to be in a dementia unit of some kind   Patient Active Problem List  Diagnoses  . B12 DEFICIENCY  . HYPERCHOLESTEROLEMIA, PURE  . ANEMIA  . MACULAR DEGENERATION  . HYPERTENSION, BENIGN ESSENTIAL  . HYPERTENSION  . External Hemorrhoids without Mention of Complication  . ALLERGIC RHINITIS  . Left Sided Ulcerative (Chronic) Colitis  . DIVERTICULOSIS, COLON  . UTI'S, RECURRENT  . OSTEOARTHRITIS  . OSTEOPOROSIS  .  MEMORY LOSS  . FREQUENCY, URINARY  . UNSPECIFIED VITAMIN D DEFICIENCY  . ANXIETY DISORDER   No past medical history on file. No past surgical history on file. History  Substance Use Topics  . Smoking status: Never Smoker   . Smokeless tobacco: Not on file  . Alcohol Use: Not on file   No family history on file. Allergies  Allergen Reactions  . Ace Inhibitors   . Atorvastatin   . Ezetimibe-Simvastatin   . Guanfacine Hcl   . Tolterodine Tartrate     REACTION: does not work   Current Outpatient Prescriptions on File Prior to Visit  Medication Sig Dispense Refill  . amLODipine (NORVASC) 5 MG tablet Take 1 tablet (5 mg total) by mouth daily.  90 tablet  3  . mesalamine (ASACOL) 400 MG EC tablet Take 3 tablets (1,200 mg total) by mouth 2 (two) times daily.  540 tablet  3  . pravastatin (PRAVACHOL) 40 MG tablet Take 1 tablet (40 mg total) by mouth daily.  90 tablet  3  . temazepam (RESTORIL) 15 MG capsule Take 1 capsule (15 mg total) by mouth at bedtime as needed  for sleep.  30 capsule  1       Review of Systems Review of Systems  Constitutional: Negative for fever, appetite change, fatigue and unexpected weight change.  Eyes: Negative for pain and visual disturbance.  Respiratory: Negative for cough and shortness of breath.   Cardiovascular: Negative for cp or palpitations    Gastrointestinal: Negative for nausea, diarrhea and constipation.  Genitourinary: Negative for urgency and frequency.  Skin: Negative for pallor or rash   Neurological: Negative for weakness, , numbness and headaches. pos for poor balance  Hematological: Negative for adenopathy. Does not bruise/bleed easily.  Psychiatric/Behavioral: Negative for dysphoric mood. The patient is not nervous/anxious. Pos for short term memory loss/ confusion at night/ agitation at night (pt denies this )         Objective:   Physical Exam  Constitutional: She appears well-developed and well-nourished. No distress.        Frail appearing elderly female  HENT:  Head: Normocephalic and atraumatic.  Right Ear: External ear normal.  Left Ear: External ear normal.  Nose: Nose normal.       No head wounds remaining   Eyes: Conjunctivae and EOM are normal. Pupils are equal, round, and reactive to light. No scleral icterus.       Poor vision due to mac deg  Neck: Normal range of motion. Neck supple. No JVD present. No thyromegaly present.  Cardiovascular: Normal rate, regular rhythm, normal heart sounds and intact distal pulses.  Exam reveals no gallop.   Pulmonary/Chest: Effort normal and breath sounds normal. No respiratory distress. She has no wheezes.  Abdominal: Soft. Bowel sounds are normal. She exhibits no distension and no mass. There is no tenderness.  Musculoskeletal: She exhibits no edema and no tenderness.  Lymphadenopathy:    She has no cervical adenopathy.  Neurological: She is alert. She has normal reflexes. She displays tremor. She displays no atrophy. No cranial nerve deficit or sensory deficit. She exhibits normal muscle tone. Coordination and gait abnormal.       Gait is slow and wide based -consistent with poor balance   Skin: Skin is warm and dry. No rash noted. No erythema. No pallor.  Psychiatric:       Poor short term memory Does repeat questions Is not agitated or confused at this time           Assessment & Plan:

## 2011-06-07 NOTE — Patient Instructions (Signed)
Labs today tetnus shot today  Try the restoril 15 mg for sleep at bed time -- have family stay with you for the next 3-4 nights Follow up 4-6 weeks - will discuss how you are doing and talk about memory further

## 2011-06-09 NOTE — Assessment & Plan Note (Signed)
Lab today on statin and diet No c/o  Disc goals for lipids and reasons to control them Rev labs with - from last draw Rev low sat fat diet in detail

## 2011-06-09 NOTE — Assessment & Plan Note (Signed)
bp in fair control at this time  No changes needed  Disc lifstyle change with low sodium diet and exercise   Labs today

## 2011-06-09 NOTE — Assessment & Plan Note (Signed)
This has become worse and begun to incorporate night time confusion/ wandering / agitation  Pt denies this entirely, family is worried for her safety and may begin to seek alt living situaiton Will try some restoril for sleep with close supervision from family (who will stay with her to monitor for falls)  Lab today for memory loss to r/o organic cause  Then at f/u will disc poss tx with aricept

## 2011-06-10 ENCOUNTER — Encounter: Payer: Self-pay | Admitting: *Deleted

## 2011-06-11 ENCOUNTER — Telehealth: Payer: Self-pay | Admitting: Family Medicine

## 2011-06-11 NOTE — Telephone Encounter (Signed)
Caller: Linda/Mother; PCP: Loura Pardon A.; CB#: [phone];  Call regarding Pt. refuses Sleeping Pills; she is agitated at night; asks if there is a cream available that can be used instead.  Emergent sx ruled out.  Advised see in 24 hours per Confusion, Disorientation, Agitation protocol. Caller relates that  PCP spoke with patient at length about this at appointment on 06/07/11 and she would prefer to ask for cream to help patient sleep so patient's spouse can get some rest.  [facility] [address].  Thank you.  If call after 1800 please call cell phone.  [phone].

## 2011-06-11 NOTE — Telephone Encounter (Signed)
I have not heard of a cream to help sleep or agitation-- is there a particular product she is thinking about that I am not aware of ?

## 2011-06-11 NOTE — Telephone Encounter (Signed)
Left message on cell phone voicemail for Ashley House to return call.

## 2011-06-12 ENCOUNTER — Telehealth: Payer: Self-pay | Admitting: Family Medicine

## 2011-06-12 NOTE — Telephone Encounter (Signed)
Great- I will wait to here back on that so I can advise further

## 2011-06-12 NOTE — Telephone Encounter (Signed)
Caller: Linda/Child; PCP: Tower, [place].; CB#: [phone]; ; ; Call regarding Follow Up Call 06/11/11; states her mother will not take her sleeping medication, and has been having agitation in the evenings.  States she has heard of "a cream you can rub on the arm which helps with agitation."  Per Epic, Dr. Glori Bickers did see the note from 06/11/11 and wanted to know if the family was thinking of a specific product, as she has not heard of such a cream.  Caller advised; states will "contact RN she knows who recommended this," and call office back.  No further questions or concerns at this time.

## 2011-06-12 NOTE — Telephone Encounter (Signed)
See phone note dated 06/12/2011

## 2011-06-12 NOTE — Telephone Encounter (Signed)
Left message on cell phone voicemail for Vaughan Basta to return call.

## 2011-06-14 ENCOUNTER — Other Ambulatory Visit: Payer: Self-pay | Admitting: Family Medicine

## 2011-06-14 NOTE — Telephone Encounter (Signed)
rx faxed to pharmacy manually Spoke with daughter Jacqlyn Larsen and advised results, she will call to let us know how it works.

## 2011-06-14 NOTE — Telephone Encounter (Signed)
Spoke with pharmacist at [facility] and was advised that they do make this, it is in a gel from.  They would need to know how much of each medication Dr. Glori Bickers wants patient to have at each dose.  Ex.  If you want patient to get 60m of Ativan at each dose, then it would need to be 551mper dose.  So it depends on what combination of these medications you want.  Please advise.

## 2011-06-14 NOTE — Telephone Encounter (Signed)
I just want to try the ativan alone first - I wrote a paper px for ativan gel , apply 5 mg topically (5 ml) qhs prn  See if that is correct for the pharmacy - can fax it  Tell family to watch her carefully for oversedation/ falls/ etc as we disc at the last visit  Thanks  Let me know how it works

## 2011-06-14 NOTE — Telephone Encounter (Signed)
Patients daughter Vaughan Basta) called to let Dr. Glori Bickers know that there is a pharmacy in [place] on [address] that will make the cream that they want for their mom.  Vaughan Basta stated that they can combine Ativan, Benadryl, and or Haldol into a cream.  Please advise.

## 2011-06-14 NOTE — Telephone Encounter (Signed)
Interesting , I have never heard of that -please [facility] and get specifics on how to order and dose it

## 2011-06-17 ENCOUNTER — Other Ambulatory Visit: Payer: Self-pay | Admitting: *Deleted

## 2011-06-17 MED ORDER — CYANOCOBALAMIN 1000 MCG/ML IJ SOLN: 1000.0000 ug | INTRAMUSCULAR | Status: DC

## 2011-06-17 NOTE — Telephone Encounter (Signed)
Yes- please sent to [facility] - I sent it

## 2011-06-17 NOTE — Telephone Encounter (Signed)
Received faxed refill request from [facility]. Chart shows that rx was sent to [facility] on 06/07/11. Spoke to pharmacist at [facility] and was advised that [facility] and [facility] told [facility]. Is it okay to send script to [facility]?

## 2011-06-18 NOTE — Telephone Encounter (Signed)
Ok - please change to 1 mg total dose qhs

## 2011-06-18 NOTE — Telephone Encounter (Signed)
Received a call from Crist Infante, pharmacist at [facility] stating that 47m is a really high dose.  He advised that 110mdose would be sufficient for a patient of this age.  Please advise.

## 2011-06-18 NOTE — Telephone Encounter (Signed)
Addended by: Harmon Dun on: 06/18/2011 05:10 PM   Modules accepted: Orders

## 2011-06-18 NOTE — Telephone Encounter (Signed)
See med list please call in the ativan 1 mg/ ml to apply 1 ml qhs  Disp # 1 month supply with 5 refils  [facility]

## 2011-06-18 NOTE — Telephone Encounter (Signed)
Rx called to [facility], #85m with 5 refills.

## 2011-06-24 ENCOUNTER — Telehealth: Payer: Self-pay | Admitting: *Deleted

## 2011-06-24 NOTE — Telephone Encounter (Signed)
Fax from [facility] stating asacol 400 mg EC tablet has been discontinued and they are asking for alternative medicine.  Fax is on your shelf.

## 2011-06-24 NOTE — Telephone Encounter (Signed)
Because I was taking this over from her GI doctor -I do not know what to sub for it.. Please ask her family to call GI and ask what they recommend, and let me know, thanks  I will hold form on my desk

## 2011-06-25 NOTE — Telephone Encounter (Signed)
Left message on machine at home for family member to return call.

## 2011-06-26 NOTE — Telephone Encounter (Signed)
Spoke with patients daughter and she stated that on the letter she received from [facility] it list the following medications as an alternative to Asacol: Apriso and Lialda.  Please advise.

## 2011-06-26 NOTE — Telephone Encounter (Signed)
Patients daughter advised as instructed via telephone.  She will call GI either today or tomorrow and get back with [place].

## 2011-06-26 NOTE — Telephone Encounter (Signed)
Have her call GI to see which one of those they prefer for her and how to dose, and I will write px if needed

## 2011-06-28 ENCOUNTER — Telehealth: Payer: Self-pay | Admitting: Internal Medicine

## 2011-06-28 MED ORDER — MESALAMINE 1.2 G PO TBEC: 2400.0000 mg | DELAYED_RELEASE_TABLET | Freq: Every day | ORAL | Status: DC

## 2011-06-28 NOTE — Telephone Encounter (Signed)
Dr. Carlean Purl, Patient needs to be switched to another medication in place of Asacol. Patient's insurance company will approve Apriso and Lialda. Is either one of these medications appropriate and if so then what is the sig?

## 2011-06-28 NOTE — Telephone Encounter (Signed)
Left a message for patient stating that I sent Lialda to her pharmacy and to call back if she has any other questions.

## 2011-06-28 NOTE — Telephone Encounter (Signed)
Lialda 1.2 g take 2 daily # 1 month or 90 days (her choice) and 1 year refill

## 2011-07-12 ENCOUNTER — Encounter: Payer: Self-pay | Admitting: Family Medicine

## 2011-07-12 ENCOUNTER — Ambulatory Visit (INDEPENDENT_AMBULATORY_CARE_PROVIDER_SITE_OTHER): Payer: Medicare Other | Admitting: Family Medicine

## 2011-07-12 VITALS — BP 120/60 | HR 68 | Temp 98.7°F | Ht 62.0 in | Wt 121.5 lb

## 2011-07-12 DIAGNOSIS — R413 Other amnesia: Secondary | ICD-10-CM

## 2011-07-12 DIAGNOSIS — E7800 Pure hypercholesterolemia, unspecified: Secondary | ICD-10-CM

## 2011-07-12 DIAGNOSIS — I10 Essential (primary) hypertension: Secondary | ICD-10-CM

## 2011-07-12 MED ORDER — MESALAMINE 1.2 G PO TBEC: 2400.0000 mg | DELAYED_RELEASE_TABLET | Freq: Every day | ORAL | Status: DC

## 2011-07-12 NOTE — Assessment & Plan Note (Signed)
Per family- has agitation and trouble sleeping  Working out [employer] / Insurance underwriter / legal matters Pt is starting to refuse meals and medications Would not take restoril- they may try the ativan cream at night  Will update me as to plans and her safety profile- as it agitates her greatly to discuss it with her in the room

## 2011-07-12 NOTE — Patient Instructions (Signed)
No change in medicines for now  Try the ativan cream for sleep when you are ready  If you are interested in trying medication for dementia/ memory  (some options include exelon patch/ aricept )- please let me know  If safety is an issue --or you need to update me in terms of legal status - please let me know

## 2011-07-12 NOTE — Assessment & Plan Note (Signed)
Stable on pravachol and diet At this point stressed imp of eating meals Lab Results  Component Value Date   CHOL 168 06/07/2011   HDL 46.70 06/07/2011   LDLCALC 93 06/07/2011   LDLDIRECT 113.9 04/02/2010   TRIG 141.0 06/07/2011   CHOLHDL 4 06/07/2011   Disc goals for lipids and reasons to control them Rev labs with pt Rev low sat fat diet in detail

## 2011-07-12 NOTE — Assessment & Plan Note (Signed)
bp in fair control at this time  No changes needed  Disc lifstyle change with low sodium diet and exercise   No hypotension or falls No changes

## 2011-07-12 NOTE — Progress Notes (Signed)
Subjective:    Patient ID: Ashley House, female    DOB: Mar 19, 1918, 78 y.o.   MRN: [id number]  HPI Here for f/u of dementia/ HTN and lipids Pt thinks she is doing well   Family here with her- they are afraid to say too much because this agitates her - but indicate that she is still experiencing increase in dementia/ sundowning / agitation/ anger  Is refusing meals and meds at times Pt adamantly denies all of this   Labs were all stable Taking pravachol -- at least some days  Lab Results  Component Value Date   CHOL 168 06/07/2011   HDL 46.70 06/07/2011   LDLCALC 93 06/07/2011   LDLDIRECT 113.9 04/02/2010   TRIG 141.0 06/07/2011   CHOLHDL 4 06/07/2011     Lost one more lb but wt leveled off at bmi of 17 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  Eyes: Negative for pain and pos for baseline blindness  Respiratory: Negative for cough and shortness of breath.   Cardiovascular: Negative for cp or palpitations    Gastrointestinal: Negative for nausea, diarrhea and constipation.  Genitourinary: Negative for urgency and frequency.  Skin: Negative for pallor or rash   Neurological: Negative for weakness, light-headedness, numbness and headaches.  Hematological: Negative for adenopathy. Does not bruise/bleed easily.  Psychiatric/Behavioral: per family pos for mood changes/ anger/ aggression/ agitation/ dementia / poor memory           Objective:   Physical Exam  Constitutional: She appears well-developed and well-nourished. No distress.       Frail appearing blind elderly female who is acting generally paranoid  HENT:  Head: Normocephalic and atraumatic.  Mouth/Throat: Oropharynx is clear and moist.  Eyes: Conjunctivae and EOM are normal. Pupils are equal, round, and reactive to light. No scleral icterus.  Neck: Normal range of motion. Neck supple. No JVD present. Carotid bruit is not present. No thyromegaly present.  Cardiovascular: Normal rate, regular rhythm and intact distal pulses.  Exam reveals no gallop.   Pulmonary/Chest: Breath sounds normal. No respiratory distress. She has no wheezes.  Abdominal: Soft. Bowel sounds are normal. She exhibits no distension, no abdominal bruit and no mass. There is no tenderness.  Musculoskeletal: Normal range of motion. She exhibits no edema and no tenderness.  Lymphadenopathy:    She has no cervical adenopathy.  Neurological: She is alert. She has normal reflexes. No cranial nerve deficit. She exhibits normal muscle tone. Coordination normal.       Mild tremor   Skin: Skin is warm and dry. No rash noted. No erythema. No pallor.  Psychiatric: Her speech is normal and behavior is normal. Her mood appears anxious. Thought content is paranoid. Cognition and memory are impaired. She expresses no homicidal and no suicidal ideation. She exhibits abnormal recent memory.  Cannot comment on judgement           Assessment & Plan:

## 2011-07-14 ENCOUNTER — Encounter: Payer: Self-pay | Admitting: Family Medicine

## 2012-07-21 ENCOUNTER — Other Ambulatory Visit: Payer: Self-pay | Admitting: Family Medicine

## 2012-07-21 NOTE — Telephone Encounter (Signed)
Faxed refill request, no recent appt and no future appt, ok to refill

## 2012-07-22 NOTE — Telephone Encounter (Signed)
Schedule f/u and refil until then please

## 2012-07-22 NOTE — Telephone Encounter (Signed)
Called # on file and no answer and no voicemail (kept ringing), will try to call back later

## 2012-07-24 NOTE — Telephone Encounter (Signed)
Called pt no answer and no voicemail (kept ringing)

## 2012-07-27 NOTE — Telephone Encounter (Signed)
Called home # and no answer, called emergency contact # Sandar Krinke and no answer and no voicemail, mailed letter letting pt know needs to schedule a follow up appointment before we can refill meds

## 2012-07-30 MED ORDER — PRAVASTATIN SODIUM 40 MG PO TABS: 40.0000 mg | ORAL_TABLET | Freq: Every day | ORAL | Status: DC

## 2012-07-30 MED ORDER — AMLODIPINE BESYLATE 5 MG PO TABS: 5.0000 mg | ORAL_TABLET | Freq: Every day | ORAL | Status: DC

## 2012-07-30 MED ORDER — MESALAMINE 1.2 G PO TBEC: 2400.0000 mg | DELAYED_RELEASE_TABLET | Freq: Every day | ORAL | Status: DC

## 2012-07-30 NOTE — Telephone Encounter (Signed)
appt scheduled and meds refilled

## 2012-08-05 ENCOUNTER — Encounter: Payer: Self-pay | Admitting: Family Medicine

## 2012-08-05 ENCOUNTER — Ambulatory Visit (INDEPENDENT_AMBULATORY_CARE_PROVIDER_SITE_OTHER): Payer: Medicare Other | Admitting: Family Medicine

## 2012-08-05 VITALS — BP 132/82 | HR 70 | Temp 98.4°F | Ht 62.0 in | Wt 120.2 lb

## 2012-08-05 DIAGNOSIS — I10 Essential (primary) hypertension: Secondary | ICD-10-CM

## 2012-08-05 DIAGNOSIS — E538 Deficiency of other specified B group vitamins: Secondary | ICD-10-CM

## 2012-08-05 DIAGNOSIS — E7800 Pure hypercholesterolemia, unspecified: Secondary | ICD-10-CM

## 2012-08-05 DIAGNOSIS — IMO0002 Reserved for concepts with insufficient information to code with codable children: Secondary | ICD-10-CM

## 2012-08-05 DIAGNOSIS — F0390 Unspecified dementia without behavioral disturbance: Secondary | ICD-10-CM

## 2012-08-05 DIAGNOSIS — R296 Repeated falls: Secondary | ICD-10-CM

## 2012-08-05 DIAGNOSIS — Z9181 History of falling: Secondary | ICD-10-CM

## 2012-08-05 DIAGNOSIS — T148XXA Other injury of unspecified body region, initial encounter: Secondary | ICD-10-CM

## 2012-08-05 LAB — COMPREHENSIVE METABOLIC PANEL
ALT: 9 U/L (ref 0–35)
AST: 15 U/L (ref 0–37)
Albumin: 3.5 g/dL (ref 3.5–5.2)
Alkaline Phosphatase: 69 U/L (ref 39–117)
Glucose, Bld: 88 mg/dL (ref 70–99)
Potassium: 3.2 mEq/L — ABNORMAL LOW (ref 3.5–5.1)
Sodium: 143 mEq/L (ref 135–145)
Total Bilirubin: 0.7 mg/dL (ref 0.3–1.2)
Total Protein: 7.4 g/dL (ref 6.0–8.3)

## 2012-08-05 LAB — LIPID PANEL
HDL: 43.2 mg/dL (ref >39.00)
Total CHOL/HDL Ratio: 3
VLDL: 19.6 mg/dL (ref 0.0–40.0)

## 2012-08-05 LAB — CBC WITH DIFFERENTIAL/PLATELET
Basophils Absolute: 0 10*3/uL (ref 0.0–0.1)
HCT: 37.2 % (ref 36.0–46.0)
Hemoglobin: 12.2 g/dL (ref 12.0–15.0)
Lymphs Abs: 1.5 10*3/uL (ref 0.7–4.0)
MCV: 85.8 fl (ref 78.0–100.0)
Monocytes Absolute: 0.5 10*3/uL (ref 0.1–1.0)
Neutro Abs: 4.8 10*3/uL (ref 1.4–7.7)
Platelets: 273 10*3/uL (ref 150.0–400.0)
RDW: 13.4 % (ref 11.5–14.6)

## 2012-08-05 LAB — TSH: TSH: 0.75 u[IU]/mL (ref 0.35–5.50)

## 2012-08-05 LAB — VITAMIN B12: Vitamin B-12: 1180 pg/mL — ABNORMAL HIGH (ref 211–911)

## 2012-08-05 MED ORDER — AMLODIPINE BESYLATE 5 MG PO TABS: 5.0000 mg | ORAL_TABLET | Freq: Every day | ORAL | Status: DC

## 2012-08-05 MED ORDER — MESALAMINE 1.2 G PO TBEC: 2400.0000 mg | DELAYED_RELEASE_TABLET | Freq: Every day | ORAL | Status: DC

## 2012-08-05 MED ORDER — PRAVASTATIN SODIUM 40 MG PO TABS: 40.0000 mg | ORAL_TABLET | Freq: Every day | ORAL | Status: DC

## 2012-08-05 NOTE — Patient Instructions (Addendum)
I recommend use of walker at all times due to falls  If you change your mind about physical therapy for balance let me know  Labs today  No change in medicines Keep skin tear on hand lightly covered and clean with soap and water

## 2012-08-05 NOTE — Progress Notes (Signed)
Subjective:    Patient ID: Ashley House, female    DOB: 09-Nov-1918, 79 y.o.   MRN: [id number]  HPI Here for f/u of chronic health problems  Feels fair to be the age she is  Nothing new going on    Dementia - per family - recent short term memory is the problem  Mood is generally ok per pt  Has agitation/ anx during the day more than at night  Today is fairly mentally sharp She can be argumentative , however    Skin tear on L hand from this am  ? What she did Td is up to date 4/13   More unsteady with time- refuses to use walker - because she is blind  Frequent falls without injuries  Family cannot force her to use walker  They also decline PT for balance/ gait   Wt is stable with bmi of 21   Hyperlipidemia On pravastatin  Due for labs  Eats a fairly healthy diet / appetite is ok   bp is stable today  No cp or palpitations or headaches or edema  No side effects to medicines  BP Readings from Last 3 Encounters:  08/05/12 132/82  07/12/11 120/60  06/07/11 150/74     Patient Active Problem List   Diagnosis Date Noted  . UNSPECIFIED VITAMIN D DEFICIENCY 04/02/2010  . ANXIETY DISORDER 04/02/2010  . B12 DEFICIENCY 11/09/2009  . Senile dementia with behavioral disturbance 10/09/2009  . FREQUENCY, URINARY 12/11/2007  . ANEMIA 05/27/2007  . Left Sided Ulcerative (Chronic) Colitis 03/06/2007  . MACULAR DEGENERATION 03/05/2007  . ALLERGIC RHINITIS 03/05/2007  . DIVERTICULOSIS, COLON 03/05/2007  . UTI'S, RECURRENT 03/05/2007  . OSTEOARTHRITIS 03/05/2007  . OSTEOPOROSIS 03/05/2007  . External Hemorrhoids without Mention of Complication 60/63/0160  . HYPERCHOLESTEROLEMIA, PURE 11/26/2006  . HYPERTENSION, BENIGN ESSENTIAL 11/26/2006   Past Medical History  Diagnosis Date  . Allergy   . Hypertension   . Osteoporosis   . Anemia   . Diverticulosis   . Gastritis   . Edema   . Macular degeneration   . Overactive bladder   . DDD (degenerative disc disease)   .  Dementia, senile    Past Surgical History  Procedure Laterality Date  . Lumbar disc surgery    . Cataract extraction     History  Substance Use Topics  . Smoking status: Never Smoker   . Smokeless tobacco: Not on file  . Alcohol Use: No   Family History  Problem Relation Age of Onset  . Kidney disease Mother   . Cancer Brother     colon   Allergies  Allergen Reactions  . Ace Inhibitors   . Atorvastatin   . Ezetimibe-Simvastatin   . Guanfacine Hcl   . Tolterodine Tartrate     REACTION: does not work   Current Outpatient Prescriptions on File Prior to Visit  Medication Sig Dispense Refill  . amLODipine (NORVASC) 5 MG tablet Take 1 tablet (5 mg total) by mouth daily.  90 tablet  0  . Cholecalciferol (VITAMIN D) 2000 UNITS CAPS Take 1 capsule by mouth daily.       . cyanocobalamin (,VITAMIN B-12,) 1000 MCG/ML injection Inject 1 mL (1,000 mcg total) into the muscle every 30 (thirty) days.  1 mL  11  . mesalamine (LIALDA) 1.2 G EC tablet Take 2 tablets (2.4 g total) by mouth daily with breakfast.  180 tablet  0  . pravastatin (PRAVACHOL) 40 MG tablet Take 1 tablet (40 mg  total) by mouth daily.  90 tablet  0   No current facility-administered medications on file prior to visit.     Review of Systems Review of Systems  Constitutional: Negative for fever, appetite change,  and unexpected weight change.  Eyes: Negative for pain and pos for poor vision  Respiratory: Negative for cough and shortness of breath.   Cardiovascular: Negative for cp or palpitations    Gastrointestinal: Negative for nausea, diarrhea and constipation.  Genitourinary: Negative for urgency and frequency.  Skin: Negative for pallor or rash   Neurological: Negative for weakness, light-headedness, numbness and headaches. pos for short term memory loss/ dementia  Hematological: Negative for adenopathy. Does not bruise/bleed easily.  Psychiatric/Behavioral: Negative for dysphoric mood. The patient is anxious/  agitated at times (per family)         Objective:   Physical Exam  Constitutional: She appears well-developed and well-nourished. No distress.  Frail appearing elderly female  HENT:  Head: Normocephalic and atraumatic.  Right Ear: External ear normal.  Left Ear: External ear normal.  Nose: Nose normal.  Mouth/Throat: Oropharynx is clear and moist.  Eyes: Conjunctivae and EOM are normal. Pupils are equal, round, and reactive to light. Right eye exhibits no discharge. Left eye exhibits no discharge. No scleral icterus.  Very poor eyesight   Neck: Normal range of motion. Neck supple. No JVD present. Carotid bruit is not present. No thyromegaly present.  Cardiovascular: Normal rate, regular rhythm and normal heart sounds.  Exam reveals no gallop.   Pulmonary/Chest: Effort normal and breath sounds normal. No respiratory distress. She has no wheezes. She has no rales.  Abdominal: Soft. Bowel sounds are normal. She exhibits no distension, no abdominal bruit and no mass. There is no tenderness.  Musculoskeletal: She exhibits no edema and no tenderness.  Varicosities noted   Lymphadenopathy:    She has no cervical adenopathy.  Neurological: She is alert. She has normal reflexes. No cranial nerve deficit. She exhibits normal muscle tone. Coordination normal.  Skin: Skin is warm and dry. No rash noted. No erythema. No pallor.  2 cm V shaped skin tear on dorsal L hand that is not bleeding and looks clean  Was cleansed and dressed with loose band aid   Psychiatric: She has a normal mood and affect.          Assessment & Plan:

## 2012-08-06 NOTE — Assessment & Plan Note (Signed)
Lipid panel today On pravastatin and fair diet

## 2012-08-06 NOTE — Assessment & Plan Note (Signed)
Level today Injections are not currently available-will need to take oral B12

## 2012-08-06 NOTE — Assessment & Plan Note (Signed)
Had a long discussion about this -pt falls despite family assistance and refuses to use her walker  She also refuses ref to PT to help with gait and balance  Disc risk of falls and fx in detail - esp in light of dementia and vision impairment

## 2012-08-06 NOTE — Assessment & Plan Note (Signed)
2 cm mild skin tear R dorsal hand - this was cleaned and dressed with loose band aid Disc care and infection prevention  Td is up to date

## 2012-08-06 NOTE — Assessment & Plan Note (Signed)
bp in fair control at this time  No changes needed  Disc lifstyle change with low sodium diet and exercise  Lab today

## 2012-08-06 NOTE — Assessment & Plan Note (Signed)
Slowly worsening -not interested in medication at this time  Disc safety Family continues to care for her

## 2012-08-07 ENCOUNTER — Encounter: Payer: Self-pay | Admitting: *Deleted

## 2012-09-01 ENCOUNTER — Telehealth (INDEPENDENT_AMBULATORY_CARE_PROVIDER_SITE_OTHER): Payer: Medicare Other | Admitting: Family Medicine

## 2012-09-01 DIAGNOSIS — E876 Hypokalemia: Secondary | ICD-10-CM | POA: Insufficient documentation

## 2012-09-01 NOTE — Telephone Encounter (Signed)
Message copied by Abner Greenspan on Tue Sep 01, 2012  7:59 AM ------      Message from: Ellamae Sia      Created: Wed Aug 26, 2012 11:48 AM      Regarding: Lab orders for Friday, 7.11.14       Lab orders, no f/u appt ------

## 2012-09-01 NOTE — Telephone Encounter (Signed)
Message copied by Abner Greenspan on Tue Sep 01, 2012  7:58 AM ------      Message from: Ellamae Sia      Created: Wed Aug 26, 2012 11:48 AM      Regarding: Lab orders for Friday, 7.11.14       Lab orders, no f/u appt ------

## 2012-09-04 ENCOUNTER — Other Ambulatory Visit (INDEPENDENT_AMBULATORY_CARE_PROVIDER_SITE_OTHER): Payer: Medicare Other

## 2012-09-04 DIAGNOSIS — E876 Hypokalemia: Secondary | ICD-10-CM

## 2012-09-04 LAB — POTASSIUM: Potassium: 3.2 mEq/L — ABNORMAL LOW (ref 3.5–5.1)

## 2012-09-06 ENCOUNTER — Telehealth: Payer: Self-pay | Admitting: Family Medicine

## 2012-09-06 MED ORDER — POTASSIUM CHLORIDE ER 10 MEQ PO CPCR: 10.0000 meq | ORAL_CAPSULE | Freq: Every day | ORAL | Status: DC

## 2012-09-06 NOTE — Telephone Encounter (Signed)
K is still low  Please call in Kcl 10 meq to take daily  Re check K in 2-4 weeks for hypokalemia please

## 2012-09-07 NOTE — Telephone Encounter (Signed)
Take the px vitamin-not the otc one, thanks

## 2012-09-07 NOTE — Telephone Encounter (Signed)
Pt's daughter notified that pt's K is still low, lab appt scheduled for 10/05/12  pt's daughter wanted to let you know that pt is already eating a lot of oranges and drinking OJ, but when they received pt's last lab results pt's daughter purchased potassium OTC 579m and pt has been taking that once a day, do you still want me to call in Kcl 10 meq and if so should she still take the potassium vitamin they purchased OTC?, please advise

## 2012-09-08 MED ORDER — POTASSIUM CHLORIDE ER 10 MEQ PO CPCR: 10.0000 meq | ORAL_CAPSULE | Freq: Every day | ORAL | Status: DC

## 2012-09-08 NOTE — Telephone Encounter (Signed)
Left voicemail requesting pt's daughter to call office

## 2012-09-08 NOTE — Telephone Encounter (Signed)
Pt's daughter Jacqlyn Larsen advise to have pt stop taking the OCT potassium and start taking the Rx, Rx sent to pharmacy

## 2012-10-05 ENCOUNTER — Other Ambulatory Visit (INDEPENDENT_AMBULATORY_CARE_PROVIDER_SITE_OTHER): Payer: Medicare Other

## 2012-10-05 DIAGNOSIS — E876 Hypokalemia: Secondary | ICD-10-CM

## 2012-10-06 ENCOUNTER — Encounter: Payer: Self-pay | Admitting: *Deleted

## 2013-01-07 ENCOUNTER — Telehealth: Payer: Self-pay

## 2013-01-07 NOTE — Telephone Encounter (Signed)
Daughter advised.

## 2013-01-07 NOTE — Telephone Encounter (Signed)
Ashley House pt's daughter said for 3 months on and off pt has loose black, tarry stools. Pt usually has 1-2 loose stools daily and yesterday pt had 4 loose black incontinent stools. Pt has dementia and colitis and Ashley House has read where Potassium can cause loose tarry stools. No blood has been seen and no abd pain but Ashley House said her mother never complains. Ashley House scheduled appt with Dr Glori Bickers on 01/12/13 at 2:15 pm. (Dr Glori Bickers out of office until 01/11/13 and Ashley House could not bring pt on Mon). Ashley House would send note to doctor in office and [facility] request cb. If pt condition were to change or worsen Ashley House will call our office prior to appt or take pt to UC if at night. Ashley House said as of today she was stopping pt from taking Potassium med.Please advise.

## 2013-01-07 NOTE — Telephone Encounter (Signed)
If this is a chronic issue, then it is okay to wait until next week assuming she doesn't acutely worsen.  Would need eval in meantime if worsening.  Okay to hold the K for a few days and see if that helps.

## 2013-01-12 ENCOUNTER — Encounter: Payer: Self-pay | Admitting: Family Medicine

## 2013-01-12 ENCOUNTER — Ambulatory Visit (INDEPENDENT_AMBULATORY_CARE_PROVIDER_SITE_OTHER): Payer: Medicare Other | Admitting: Family Medicine

## 2013-01-12 VITALS — BP 132/68 | HR 61 | Temp 98.1°F | Ht 62.0 in | Wt 121.5 lb

## 2013-01-12 DIAGNOSIS — D649 Anemia, unspecified: Secondary | ICD-10-CM

## 2013-01-12 DIAGNOSIS — K5150 Left sided colitis without complications: Secondary | ICD-10-CM

## 2013-01-12 DIAGNOSIS — R195 Other fecal abnormalities: Secondary | ICD-10-CM

## 2013-01-12 LAB — BASIC METABOLIC PANEL
BUN: 11 mg/dL (ref 6–23)
CO2: 30 mEq/L (ref 19–32)
Chloride: 102 mEq/L (ref 96–112)
Creatinine, Ser: 0.7 mg/dL (ref 0.4–1.2)
Potassium: 4.3 mEq/L (ref 3.5–5.1)

## 2013-01-12 LAB — CBC WITH DIFFERENTIAL/PLATELET
Basophils Absolute: 0 10*3/uL (ref 0.0–0.1)
Basophils Relative: 0.4 % (ref 0.0–3.0)
HCT: 36.9 % (ref 36.0–46.0)
Hemoglobin: 12.3 g/dL (ref 12.0–15.0)
Lymphocytes Relative: 23.1 % (ref 12.0–46.0)
MCHC: 33.4 g/dL (ref 30.0–36.0)
MCV: 85.2 fl (ref 78.0–100.0)
Monocytes Relative: 5.7 % (ref 3.0–12.0)
Neutro Abs: 4.4 10*3/uL (ref 1.4–7.7)
Neutrophils Relative %: 70.7 % (ref 43.0–77.0)
Platelets: 284 10*3/uL (ref 150.0–400.0)
RBC: 4.33 Mil/uL (ref 3.87–5.11)
RDW: 13.3 % (ref 11.5–14.6)
WBC: 6.2 10*3/uL (ref 4.5–10.5)

## 2013-01-12 NOTE — Progress Notes (Signed)
Pre-visit discussion using our clinic review tool. No additional management support is needed unless otherwise documented below in the visit note.  

## 2013-01-12 NOTE — Progress Notes (Signed)
Subjective:    Patient ID: Gerri Lins, female    DOB: 11/25/1918, 79 y.o.   MRN: [id number]  HPI Here with dark stools or 3 mo  This started around the time she started K supplementation  Family member notes - she has had looser stool than normal for 2 mo and she has no control at all Wears adult diapers  Has BMs at least 3 times per day- and sometimes this leaks out  Stool is often dark - but not every single day   She is eating well and not loosing weight  No abdominal pain    Wondered about K causing problems   Had to change her colon medication- prev one went off the market/unavail   No iron or pepto or beets or blueberries   Has a hx of gastritis (egd in 08) Also colitis-is on lialda for that -- UC    Patient Active Problem List   Diagnosis Date Noted  . Hypokalemia 09/01/2012  . Frequent falls 08/05/2012  . Skin tear 08/05/2012  . UNSPECIFIED VITAMIN D DEFICIENCY 04/02/2010  . ANXIETY DISORDER 04/02/2010  . B12 DEFICIENCY 11/09/2009  . Senile dementia with behavioral disturbance 10/09/2009  . FREQUENCY, URINARY 12/11/2007  . ANEMIA 05/27/2007  . Left Sided Ulcerative (Chronic) Colitis 03/06/2007  . MACULAR DEGENERATION 03/05/2007  . ALLERGIC RHINITIS 03/05/2007  . DIVERTICULOSIS, COLON 03/05/2007  . UTI'S, RECURRENT 03/05/2007  . OSTEOARTHRITIS 03/05/2007  . OSTEOPOROSIS 03/05/2007  . External Hemorrhoids without Mention of Complication 16/38/4665  . HYPERCHOLESTEROLEMIA, PURE 11/26/2006  . HYPERTENSION, BENIGN ESSENTIAL 11/26/2006   Past Medical History  Diagnosis Date  . Allergy   . Hypertension   . Osteoporosis   . Anemia   . Diverticulosis   . Gastritis   . Edema   . Macular degeneration   . Overactive bladder   . DDD (degenerative disc disease)   . Dementia, senile    Past Surgical History  Procedure Laterality Date  . Lumbar disc surgery    . Cataract extraction     History  Substance Use Topics  . Smoking status: Never Smoker    . Smokeless tobacco: Not on file  . Alcohol Use: No   Family History  Problem Relation Age of Onset  . Kidney disease Mother   . Cancer Brother     colon   Allergies  Allergen Reactions  . Ace Inhibitors   . Atorvastatin   . Ezetimibe-Simvastatin   . Guanfacine Hcl   . Tolterodine Tartrate     REACTION: does not work   Current Outpatient Prescriptions on File Prior to Visit  Medication Sig Dispense Refill  . amLODipine (NORVASC) 5 MG tablet Take 1 tablet (5 mg total) by mouth daily.  90 tablet  3  . Cholecalciferol (VITAMIN D) 2000 UNITS CAPS Take 1 capsule by mouth daily.       . cyanocobalamin (,VITAMIN B-12,) 1000 MCG/ML injection Inject 1 mL (1,000 mcg total) into the muscle every 30 (thirty) days.  1 mL  11  . mesalamine (LIALDA) 1.2 G EC tablet Take 2 tablets (2.4 g total) by mouth daily with breakfast.  180 tablet  3  . potassium chloride (MICRO-K) 10 MEQ CR capsule Take 1 capsule (10 mEq total) by mouth daily.  30 capsule  11  . pravastatin (PRAVACHOL) 40 MG tablet Take 1 tablet (40 mg total) by mouth daily.  90 tablet  3   No current facility-administered medications on file prior to visit.  Review of Systems Review of Systems  Constitutional: Negative for fever, appetite change, fatigue and unexpected weight change.  Eyes: Negative for pain and visual disturbance.  Respiratory: Negative for cough and shortness of breath.   Cardiovascular: Negative for cp or palpitations    Gastrointestinal: Negative for nausea,  and constipation. neg for blood in stool/ abd pain /bloating  Genitourinary: Negative for urgency and frequency.  Skin: Negative for pallor or rash   Neurological: Negative for weakness, light-headedness, numbness and headaches.  Hematological: Negative for adenopathy. Does not bruise/bleed easily.  Psychiatric/Behavioral: Negative for dysphoric mood. The patient is occ agitated , pos for dementia       Objective:   Physical Exam  Constitutional:  She appears well-developed and well-nourished. No distress.  Frail appearing elderly female with dementia   HENT:  Head: Normocephalic and atraumatic.  Mouth/Throat: Oropharynx is clear and moist.  Eyes: Conjunctivae and EOM are normal. Pupils are equal, round, and reactive to light. No scleral icterus.  Neck: Normal range of motion. Neck supple. No JVD present. Carotid bruit is not present. No thyromegaly present.  Cardiovascular: Normal rate, regular rhythm, normal heart sounds and intact distal pulses.  Exam reveals no gallop.   Pulmonary/Chest: Effort normal and breath sounds normal. No respiratory distress. She has no wheezes. She exhibits no tenderness.  Abdominal: Soft. Bowel sounds are normal. She exhibits no distension, no abdominal bruit and no mass. There is no tenderness.  Genitourinary: Rectal exam shows external hemorrhoid. Rectal exam shows no mass and no tenderness. Guaiac negative stool.  Musculoskeletal: Normal range of motion. She exhibits no edema.  Lymphadenopathy:    She has no cervical adenopathy.  Neurological: She is alert. She has normal reflexes. No cranial nerve deficit. She exhibits normal muscle tone. Coordination normal.  Skin: Skin is warm and dry. No rash noted. No erythema. No pallor.  Psychiatric:  Dementia with mild agitation today           Assessment & Plan:

## 2013-01-12 NOTE — Patient Instructions (Signed)
No blood in stool today  Please do the stool card when you can  Labs today for cbc and also for chemistries  Start back the potassium and I will advise you further based on potassium level

## 2013-01-13 NOTE — Assessment & Plan Note (Signed)
Lab today  Cbc nl last check  Has UC and new diarrhea with dark stool (guiac neg today) Cbc today

## 2013-01-13 NOTE — Assessment & Plan Note (Signed)
Pt had to change from asacol to lialda in June due to an inability to get it / manufacturing problem  Now having diarrhea and dark (guiac neg today) stool  Will check cbc and also IFOB card  Doubt this is from K supplement  Reassuring exam

## 2013-01-13 NOTE — Assessment & Plan Note (Signed)
guiac neg today  Diarrhea  ? Related to change in UC medicine

## 2013-01-14 ENCOUNTER — Other Ambulatory Visit: Payer: Self-pay | Admitting: Family Medicine

## 2013-02-10 ENCOUNTER — Encounter: Payer: Self-pay | Admitting: *Deleted

## 2013-02-15 ENCOUNTER — Other Ambulatory Visit (INDEPENDENT_AMBULATORY_CARE_PROVIDER_SITE_OTHER): Payer: Medicare Other

## 2013-02-15 DIAGNOSIS — K5150 Left sided colitis without complications: Secondary | ICD-10-CM

## 2013-02-15 LAB — FECAL OCCULT BLOOD, IMMUNOCHEMICAL: Fecal Occult Bld: NEGATIVE

## 2013-02-23 ENCOUNTER — Telehealth: Payer: Self-pay | Admitting: Family Medicine

## 2013-02-23 ENCOUNTER — Other Ambulatory Visit: Payer: Self-pay | Admitting: Internal Medicine

## 2013-02-23 NOTE — Telephone Encounter (Signed)
Let pt's family know I corresponded with Dr Bunnie Pion wants her to stop the lialda alltogether for a while and see if the diarrhea goes away- if not let me know (give it at least a week)  If not improved we need to do a stool test  Then we can disc another colitis med if needed  thanks

## 2013-02-24 NOTE — Telephone Encounter (Signed)
Pt's daughter notified of Dr. Marliss Coots comments and verbalized understanding, daughter will update [place] a a week or so to let us know how pt is doing

## 2013-03-29 ENCOUNTER — Other Ambulatory Visit: Payer: Self-pay | Admitting: Family Medicine

## 2013-03-29 NOTE — Telephone Encounter (Signed)
Patient's last office visit was 01/12/13.  Ok to refill?

## 2013-03-29 NOTE — Telephone Encounter (Signed)
Please refill for a year  

## 2013-03-30 NOTE — Telephone Encounter (Addendum)
Daughter Jacqlyn Larsen called back and let us know since stopping the lialda pt hasn't had any more bad bouts of diarrhea and it is better.  Daughter did want to ask your advise on pt. Pt is having a really bad time with agitation at night. Pt is also wondering outside and a little combative and daughter wanted to know if you would recommend prescribing seroquel. They have tried sleeping aids on pt but it makes her agitation worse, daughter said that she spoke to a few friends and they told her that seroquel worked good on their parents, pt uses [facility] [address]

## 2013-03-30 NOTE — Telephone Encounter (Signed)
done

## 2013-03-31 MED ORDER — QUETIAPINE FUMARATE 25 MG PO TABS: 25.0000 mg | ORAL_TABLET | Freq: Every day | ORAL | Status: DC

## 2013-03-31 NOTE — Telephone Encounter (Signed)
Watch her closely and let me know how it goes

## 2013-03-31 NOTE — Telephone Encounter (Signed)
Pt's daughter Jacqlyn Larsen notified of Dr. Marliss Coots recommendations regarding seroquel: "There is some risk with it - use of seroquel is "off label" for elderly people with dementia related agitation and psychosis - there is a black box warning that it can increase risk of death - and they need to know that. Primarily- it is very sedating - and could cause falls/ confusion etc - would need someone to stay with her and closely watch her on this med If they still want to try it - would have to go with a very low dose with close supervision"  daughter said they want to try the lowest dose of seroquel and want to make sure you prescribe the tablets so they can crush it up at night, daughter said they will make sure that someone is there at night to watch her

## 2013-03-31 NOTE — Addendum Note (Signed)
Addended by: Loura Pardon A on: 03/31/2013 12:34 PM   Modules accepted: Orders

## 2013-03-31 NOTE — Telephone Encounter (Signed)
I sent it to her pharmacy

## 2013-03-31 NOTE — Telephone Encounter (Signed)
There is some risk with it - use of seroquel is "off label" for elderly people with dementia related agitation and psychosis - there is a black box warning that it can increase risk of death - and they need to know that  Primarily- it is very sedating - and could cause falls/ confusion etc - would need someone to stay with her and closely watch her on this med  If they still want to try it - would have to go with a very low dose with close supervision  Let me know what they think

## 2013-09-22 ENCOUNTER — Other Ambulatory Visit: Payer: Self-pay | Admitting: Family Medicine

## 2013-09-22 NOTE — Telephone Encounter (Signed)
done

## 2013-09-22 NOTE — Telephone Encounter (Signed)
Electronic refill request, no recent/future appt., please advise  

## 2013-09-22 NOTE — Telephone Encounter (Signed)
Please refill for 6 mo 

## 2013-12-18 ENCOUNTER — Telehealth: Payer: Self-pay

## 2013-12-18 NOTE — Telephone Encounter (Signed)
LVM for pt to call back.    RE: scheduling AWV for 2015 with NP or PA if pt allows.

## 2014-03-15 ENCOUNTER — Other Ambulatory Visit: Payer: Self-pay | Admitting: Family Medicine

## 2014-03-15 NOTE — Telephone Encounter (Signed)
Electronic refill request, no recent/future appt., please advise  

## 2014-03-15 NOTE — Telephone Encounter (Signed)
Please schedule a spring f/u and refill until then Thanks

## 2014-03-17 NOTE — Telephone Encounter (Signed)
Left voicemail requesting pt to call office back 

## 2014-03-17 NOTE — Telephone Encounter (Signed)
Pt called in to check on refill. Follow up appt scheduled 06/03/14 @ 11:15am Can we go ahead and fill her meds?

## 2014-04-13 ENCOUNTER — Ambulatory Visit (INDEPENDENT_AMBULATORY_CARE_PROVIDER_SITE_OTHER): Payer: Medicare Other | Admitting: Family Medicine

## 2014-04-13 ENCOUNTER — Encounter: Payer: Self-pay | Admitting: Family Medicine

## 2014-04-13 VITALS — BP 140/78 | HR 72 | Temp 98.1°F | Wt 108.5 lb

## 2014-04-13 DIAGNOSIS — L989 Disorder of the skin and subcutaneous tissue, unspecified: Secondary | ICD-10-CM

## 2014-04-13 NOTE — Progress Notes (Signed)
Pre visit review using our clinic review tool, if applicable. No additional management support is needed unless otherwise documented below in the visit note.  Lesion on R lower lateral shin, longstanding, recently puffy and irritated.  Less irritated today.  Not draining.  Scabbed over now.  Patient and family would like to avoid procedures if possible.  No trauma.   Meds, vitals, and allergies reviewed.   ROS: See HPI.  Otherwise, noncontributory.  nad R lower shin with mildly irritated 2.5cm diameter round raised lesion with some adherent scab, not ttp, not fluctuant, no drainage.

## 2014-04-13 NOTE — Patient Instructions (Signed)
Use neosporin and a nonstick bandage for now.  Update [place] in a few days.  If this isn't healing well, we may need to set up a dermatology appointment since it could be a skin cancer.  Take care.  Glad to see you.

## 2014-04-14 DIAGNOSIS — L989 Disorder of the skin and subcutaneous tissue, unspecified: Secondary | ICD-10-CM | POA: Insufficient documentation

## 2014-04-14 NOTE — Assessment & Plan Note (Signed)
Covered with neosporin and nonstick bandage.  Looks to be irritated and not infected.  D/w family about ddx, possible SCC based on exam.  It is already some better today.  If continues to improve, then no other intervention.  Due to size, if not better/resolving then call back and we can refer to derm.  They agree.  Routed to PCP as FYI.

## 2014-04-18 ENCOUNTER — Telehealth: Payer: Self-pay

## 2014-04-18 DIAGNOSIS — L989 Disorder of the skin and subcutaneous tissue, unspecified: Secondary | ICD-10-CM

## 2014-04-18 NOTE — Telephone Encounter (Signed)
Derm Appt made for tomorrow 04/19/14 at 2pm. [facility]

## 2014-04-18 NOTE — Telephone Encounter (Signed)
Becky left v/m requesting cb; spoke with Jacqlyn Larsen pt was seen 04/14/14; lesion on leg is no better and request dermatology consult; on 04/15/14 started to have drainage from lesion and looks like piece of lesion has come off. Jacqlyn Larsen wants pt seen in [place] at [facility] dermatology if possible. Becky request cb and will call office back if area worsens prior to cb.

## 2014-04-18 NOTE — Telephone Encounter (Signed)
Referral put in.  Thanks.

## 2014-04-19 ENCOUNTER — Other Ambulatory Visit: Payer: Self-pay | Admitting: Dermatology

## 2014-04-19 DIAGNOSIS — C44722 Squamous cell carcinoma of skin of right lower limb, including hip: Secondary | ICD-10-CM | POA: Diagnosis not present

## 2014-04-29 DIAGNOSIS — C4492 Squamous cell carcinoma of skin, unspecified: Secondary | ICD-10-CM | POA: Diagnosis not present

## 2014-05-09 ENCOUNTER — Encounter (HOSPITAL_BASED_OUTPATIENT_CLINIC_OR_DEPARTMENT_OTHER): Payer: Self-pay | Admitting: *Deleted

## 2014-05-09 NOTE — Progress Notes (Signed)
Pt at home with husband-has demenia-but is aware of surgery-some days clearer than others-cooperative-to come for ekg-bmet-daughter has POA-will bring papers if she needs to sign permit

## 2014-05-11 ENCOUNTER — Other Ambulatory Visit: Payer: Self-pay

## 2014-05-11 ENCOUNTER — Encounter (HOSPITAL_BASED_OUTPATIENT_CLINIC_OR_DEPARTMENT_OTHER)
Admission: RE | Admit: 2014-05-11 | Discharge: 2014-05-11 | Disposition: A | Discharge status: Home / Self Care | Payer: Medicare Other | Source: Ambulatory Visit | Attending: Plastic Surgery | Admitting: Plastic Surgery

## 2014-05-11 DIAGNOSIS — E780 Pure hypercholesterolemia: Secondary | ICD-10-CM | POA: Diagnosis not present

## 2014-05-11 DIAGNOSIS — I10 Essential (primary) hypertension: Secondary | ICD-10-CM | POA: Diagnosis not present

## 2014-05-11 DIAGNOSIS — C44722 Squamous cell carcinoma of skin of right lower limb, including hip: Secondary | ICD-10-CM | POA: Diagnosis not present

## 2014-05-11 DIAGNOSIS — E876 Hypokalemia: Secondary | ICD-10-CM | POA: Diagnosis not present

## 2014-05-11 DIAGNOSIS — D649 Anemia, unspecified: Secondary | ICD-10-CM | POA: Diagnosis not present

## 2014-05-11 DIAGNOSIS — F0390 Unspecified dementia without behavioral disturbance: Secondary | ICD-10-CM | POA: Diagnosis not present

## 2014-05-11 DIAGNOSIS — M810 Age-related osteoporosis without current pathological fracture: Secondary | ICD-10-CM | POA: Diagnosis not present

## 2014-05-11 DIAGNOSIS — K5730 Diverticulosis of large intestine without perforation or abscess without bleeding: Secondary | ICD-10-CM | POA: Diagnosis not present

## 2014-05-11 DIAGNOSIS — Z8711 Personal history of peptic ulcer disease: Secondary | ICD-10-CM | POA: Diagnosis not present

## 2014-05-11 DIAGNOSIS — N3281 Overactive bladder: Secondary | ICD-10-CM | POA: Diagnosis not present

## 2014-05-11 DIAGNOSIS — F419 Anxiety disorder, unspecified: Secondary | ICD-10-CM | POA: Diagnosis not present

## 2014-05-11 DIAGNOSIS — K2970 Gastritis, unspecified, without bleeding: Secondary | ICD-10-CM | POA: Diagnosis not present

## 2014-05-11 DIAGNOSIS — E538 Deficiency of other specified B group vitamins: Secondary | ICD-10-CM | POA: Diagnosis not present

## 2014-05-11 DIAGNOSIS — F17210 Nicotine dependence, cigarettes, uncomplicated: Secondary | ICD-10-CM | POA: Diagnosis not present

## 2014-05-11 LAB — BASIC METABOLIC PANEL
ANION GAP: 8 (ref 5–15)
BUN: 9 mg/dL (ref 6–23)
CO2: 27 mmol/L (ref 19–32)
Calcium: 9.4 mg/dL (ref 8.4–10.5)
Chloride: 105 mmol/L (ref 96–112)
Creatinine, Ser: 0.91 mg/dL (ref 0.50–1.10)
GFR calc Af Amer: 61 mL/min — ABNORMAL LOW (ref >90)
GFR calc non Af Amer: 52 mL/min — ABNORMAL LOW (ref >90)
Glucose, Bld: 61 mg/dL — ABNORMAL LOW (ref 70–99)
Potassium: 3.8 mmol/L (ref 3.5–5.1)
SODIUM: 140 mmol/L (ref 135–145)

## 2014-05-12 NOTE — H&P (Signed)
  Subjective:   Patient ID: Ashley House is a 81 y.o. female.  HPI  Referred by Dr. Elvera Lennox following recent biopsy RLE lesion with SCC well differentiated. Has been present for at least a month and rapidly progressed with ulceration. This is first skin cancer diagnosis.    Review of Systems  HENT: Positive for hearing loss.  Eyes: Positive for visual disturbance.  Hematological: Bruises/bleeds easily.  Psychiatric/Behavioral: Positive for sleep disturbance.   Remainder 12 point review negative    Objective:   Physical Exam  Cardiovascular: Normal rate, regular rhythm and normal heart sounds.  Pulmonary/Chest: Effort normal and breath sounds normal.  Neurological: She is alert.  Skin:  R lateral leg with nearly 3 cm hypertrophic ulcerated mass  +varicosities and edema of leg  superior and medial to lesion is brown flaky lesion flat- per daughters, current mass started with this appearance.     Assessment:     SCC RLE     Plan:     Has keratocanthoma like features clinically. Rec excision, will plan in OR under sedation. Counseled will be large defect and may take many months to heal, especially in setting of her age, lower extremity edema and varicosities. Plan possible A Cell application to aid with healing by secondary intention. Counseled I do not rec any skin grafting in order to limit her surgery and recovery as well as high risk graft failure given above. Rec they buy OTC compression or at least knee high socks to aid with edema. Daughter is nurse and will do dressing changes, but request HH to aid with this. Picture   Her daughter Jacqlyn Larsen is [place], MD [facility]astic [facility] Surgery [phone]

## 2014-05-13 ENCOUNTER — Encounter (HOSPITAL_BASED_OUTPATIENT_CLINIC_OR_DEPARTMENT_OTHER): Payer: Self-pay | Admitting: Anesthesiology

## 2014-05-13 ENCOUNTER — Encounter (HOSPITAL_BASED_OUTPATIENT_CLINIC_OR_DEPARTMENT_OTHER)
Admission: RE | Disposition: A | Discharge status: Home / Self Care | Payer: Self-pay | Source: Ambulatory Visit | Attending: Plastic Surgery

## 2014-05-13 ENCOUNTER — Ambulatory Visit (HOSPITAL_BASED_OUTPATIENT_CLINIC_OR_DEPARTMENT_OTHER): Payer: Medicare Other | Admitting: Anesthesiology

## 2014-05-13 ENCOUNTER — Ambulatory Visit (HOSPITAL_BASED_OUTPATIENT_CLINIC_OR_DEPARTMENT_OTHER)
Admission: RE | Admit: 2014-05-13 | Discharge: 2014-05-13 | Disposition: A | Discharge status: Home / Self Care | Payer: Medicare Other | Source: Ambulatory Visit | Attending: Plastic Surgery | Admitting: Plastic Surgery

## 2014-05-13 DIAGNOSIS — Z8711 Personal history of peptic ulcer disease: Secondary | ICD-10-CM | POA: Diagnosis not present

## 2014-05-13 DIAGNOSIS — I10 Essential (primary) hypertension: Secondary | ICD-10-CM | POA: Diagnosis not present

## 2014-05-13 DIAGNOSIS — M810 Age-related osteoporosis without current pathological fracture: Secondary | ICD-10-CM | POA: Diagnosis not present

## 2014-05-13 DIAGNOSIS — E780 Pure hypercholesterolemia: Secondary | ICD-10-CM | POA: Diagnosis not present

## 2014-05-13 DIAGNOSIS — C44722 Squamous cell carcinoma of skin of right lower limb, including hip: Secondary | ICD-10-CM | POA: Diagnosis not present

## 2014-05-13 DIAGNOSIS — F419 Anxiety disorder, unspecified: Secondary | ICD-10-CM | POA: Insufficient documentation

## 2014-05-13 DIAGNOSIS — E876 Hypokalemia: Secondary | ICD-10-CM | POA: Diagnosis not present

## 2014-05-13 DIAGNOSIS — D649 Anemia, unspecified: Secondary | ICD-10-CM | POA: Diagnosis not present

## 2014-05-13 DIAGNOSIS — N3281 Overactive bladder: Secondary | ICD-10-CM | POA: Insufficient documentation

## 2014-05-13 DIAGNOSIS — E538 Deficiency of other specified B group vitamins: Secondary | ICD-10-CM | POA: Diagnosis not present

## 2014-05-13 DIAGNOSIS — F17210 Nicotine dependence, cigarettes, uncomplicated: Secondary | ICD-10-CM | POA: Insufficient documentation

## 2014-05-13 DIAGNOSIS — K5730 Diverticulosis of large intestine without perforation or abscess without bleeding: Secondary | ICD-10-CM | POA: Insufficient documentation

## 2014-05-13 DIAGNOSIS — F0390 Unspecified dementia without behavioral disturbance: Secondary | ICD-10-CM | POA: Insufficient documentation

## 2014-05-13 DIAGNOSIS — I872 Venous insufficiency (chronic) (peripheral): Secondary | ICD-10-CM | POA: Diagnosis not present

## 2014-05-13 DIAGNOSIS — K2970 Gastritis, unspecified, without bleeding: Secondary | ICD-10-CM | POA: Insufficient documentation

## 2014-05-13 HISTORY — PX: APPLICATION OF A-CELL OF EXTREMITY: SHX6303

## 2014-05-13 HISTORY — PX: LESION REMOVAL: SHX5196

## 2014-05-13 HISTORY — DX: Complete loss of teeth, unspecified cause, unspecified class: Z97.2

## 2014-05-13 HISTORY — DX: Complete loss of teeth, unspecified cause, unspecified class: K08.109

## 2014-05-13 LAB — POCT HEMOGLOBIN-HEMACUE: Hemoglobin: 12.7 g/dL (ref 12.0–15.0)

## 2014-05-13 SURGERY — WIDE EXCISION, LESION, UPPER EXTREMITY
Anesthesia: Monitor Anesthesia Care | Site: Leg Lower | Laterality: Right

## 2014-05-13 MED ORDER — FENTANYL CITRATE 0.05 MG/ML IJ SOLN
INTRAMUSCULAR | Status: DC | PRN
  Administered 2014-05-13 (×2): 25 ug via INTRAVENOUS

## 2014-05-13 MED ORDER — LACTATED RINGERS IV SOLN
INTRAVENOUS | Status: DC
  Administered 2014-05-13: 09:00:00 via INTRAVENOUS

## 2014-05-13 MED ORDER — BUPIVACAINE-EPINEPHRINE (PF) 0.25% -1:200000 IJ SOLN
INTRAMUSCULAR | Status: AC
  Filled 2014-05-13: qty 30

## 2014-05-13 MED ORDER — LIDOCAINE HCL (CARDIAC) 20 MG/ML IV SOLN
INTRAVENOUS | Status: DC | PRN
  Administered 2014-05-13: 10 mg via INTRAVENOUS

## 2014-05-13 MED ORDER — CEFAZOLIN SODIUM-DEXTROSE 2-3 GM-% IV SOLR
INTRAVENOUS | Status: AC
  Filled 2014-05-13: qty 50

## 2014-05-13 MED ORDER — FENTANYL CITRATE 0.05 MG/ML IJ SOLN
INTRAMUSCULAR | Status: AC
  Filled 2014-05-13: qty 4

## 2014-05-13 MED ORDER — BUPIVACAINE-EPINEPHRINE 0.25% -1:200000 IJ SOLN
INTRAMUSCULAR | Status: DC | PRN
  Administered 2014-05-13: 10 mL

## 2014-05-13 MED ORDER — FENTANYL CITRATE 0.05 MG/ML IJ SOLN
25.0000 ug | INTRAMUSCULAR | Status: DC | PRN
Start: 2014-05-13 — End: 2014-05-13

## 2014-05-13 MED ORDER — ONDANSETRON HCL 4 MG/2ML IJ SOLN
INTRAMUSCULAR | Status: DC | PRN
  Administered 2014-05-13: 4 mg via INTRAVENOUS

## 2014-05-13 MED ORDER — CEFAZOLIN SODIUM-DEXTROSE 2-3 GM-% IV SOLR
INTRAVENOUS | Status: DC | PRN
  Administered 2014-05-13: 2 g via INTRAVENOUS

## 2014-05-13 MED ORDER — PROPOFOL INFUSION 10 MG/ML OPTIME
INTRAVENOUS | Status: DC | PRN
  Administered 2014-05-13 (×2): 10 mL via INTRAVENOUS
  Administered 2014-05-13: 20 mL via INTRAVENOUS

## 2014-05-13 MED ORDER — MIDAZOLAM HCL 2 MG/2ML IJ SOLN: 1.0000 mg | INTRAMUSCULAR | Status: DC | PRN

## 2014-05-13 MED ORDER — FENTANYL CITRATE 0.05 MG/ML IJ SOLN: 50.0000 ug | INTRAMUSCULAR | Status: DC | PRN

## 2014-05-13 MED ORDER — CEFAZOLIN SODIUM-DEXTROSE 2-3 GM-% IV SOLR: 2.0000 g | INTRAVENOUS | Status: DC

## 2014-05-13 MED ORDER — HYDROCODONE-ACETAMINOPHEN 5-325 MG PO TABS: 1.0000 | ORAL_TABLET | Freq: Four times a day (QID) | ORAL | Status: DC | PRN

## 2014-05-13 MED ORDER — ONDANSETRON HCL 4 MG/2ML IJ SOLN: 4.0000 mg | Freq: Once | INTRAMUSCULAR | Status: DC | PRN

## 2014-05-13 SURGICAL SUPPLY — 74 items
BAG DECANTER FOR FLEXI CONT (MISCELLANEOUS) IMPLANT
BANDAGE ELASTIC 3 VELCRO ST LF (GAUZE/BANDAGES/DRESSINGS) IMPLANT
BANDAGE ELASTIC 4 VELCRO ST LF (GAUZE/BANDAGES/DRESSINGS) ×3 IMPLANT
BANDAGE ELASTIC 6 VELCRO ST LF (GAUZE/BANDAGES/DRESSINGS) IMPLANT
BLADE CLIPPER SURG (BLADE) IMPLANT
BLADE SURG 10 STRL SS (BLADE) ×3 IMPLANT
BLADE SURG 15 STRL LF DISP TIS (BLADE) ×1 IMPLANT
BLADE SURG 15 STRL SS (BLADE) ×2
BNDG GAUZE ELAST 4 BULKY (GAUZE/BANDAGES/DRESSINGS) IMPLANT
CANISTER SUCT 1200ML W/VALVE (MISCELLANEOUS) IMPLANT
CHLORAPREP W/TINT 26ML (MISCELLANEOUS) IMPLANT
CLOSURE WOUND 1/2 X4 (GAUZE/BANDAGES/DRESSINGS)
CLOSURE WOUND 1/4X4 (GAUZE/BANDAGES/DRESSINGS)
COTTONBALL LRG STERILE PKG (GAUZE/BANDAGES/DRESSINGS) IMPLANT
COVER BACK TABLE 60X90IN (DRAPES) ×3 IMPLANT
COVER MAYO STAND STRL (DRAPES) ×3 IMPLANT
DECANTER SPIKE VIAL GLASS SM (MISCELLANEOUS) IMPLANT
DRAPE INCISE IOBAN 66X45 STRL (DRAPES) IMPLANT
DRAPE U-SHAPE 76X120 STRL (DRAPES) ×3 IMPLANT
DRSG ADAPTIC 3X8 NADH LF (GAUZE/BANDAGES/DRESSINGS) ×3 IMPLANT
DRSG EMULSION OIL 3X3 NADH (GAUZE/BANDAGES/DRESSINGS) ×3 IMPLANT
DRSG PAD ABDOMINAL 8X10 ST (GAUZE/BANDAGES/DRESSINGS) IMPLANT
ELECT COATED BLADE 2.86 ST (ELECTRODE) IMPLANT
ELECT NEEDLE BLADE 2-5/6 (NEEDLE) IMPLANT
ELECT REM PT RETURN 9FT ADLT (ELECTROSURGICAL) ×3
ELECTRODE REM PT RTRN 9FT ADLT (ELECTROSURGICAL) ×1 IMPLANT
GAUZE SPONGE 4X4 12PLY STRL (GAUZE/BANDAGES/DRESSINGS) ×3 IMPLANT
GAUZE XEROFORM 1X8 LF (GAUZE/BANDAGES/DRESSINGS) IMPLANT
GAUZE XEROFORM 5X9 LF (GAUZE/BANDAGES/DRESSINGS) IMPLANT
GLOVE BIO SURGEON STRL SZ 6 (GLOVE) ×6 IMPLANT
GLOVE BIO SURGEON STRL SZ 6.5 (GLOVE) ×4 IMPLANT
GLOVE BIO SURGEONS STRL SZ 6.5 (GLOVE) ×2
GOWN STRL REUS W/ TWL LRG LVL3 (GOWN DISPOSABLE) ×2 IMPLANT
GOWN STRL REUS W/TWL LRG LVL3 (GOWN DISPOSABLE) ×4
LIQUID BAND (GAUZE/BANDAGES/DRESSINGS) IMPLANT
MATRIX SURGICAL PSM 7X10CM (Tissue) ×3 IMPLANT
NEEDLE PRECISIONGLIDE 27X1.5 (NEEDLE) ×3 IMPLANT
NS IRRIG 1000ML POUR BTL (IV SOLUTION) ×3 IMPLANT
PACK BASIN DAY SURGERY FS (CUSTOM PROCEDURE TRAY) ×3 IMPLANT
PAD CAST 3X4 CTTN HI CHSV (CAST SUPPLIES) IMPLANT
PAD CAST 4YDX4 CTTN HI CHSV (CAST SUPPLIES) ×1 IMPLANT
PADDING CAST COTTON 3X4 STRL (CAST SUPPLIES)
PADDING CAST COTTON 4X4 STRL (CAST SUPPLIES) ×2
PENCIL BUTTON HOLSTER BLD 10FT (ELECTRODE) ×3 IMPLANT
SHEET MEDIUM DRAPE 40X70 STRL (DRAPES) IMPLANT
SLEEVE SCD COMPRESS KNEE MED (MISCELLANEOUS) IMPLANT
SPONGE GAUZE 2X2 8PLY STER LF (GAUZE/BANDAGES/DRESSINGS)
SPONGE GAUZE 2X2 8PLY STRL LF (GAUZE/BANDAGES/DRESSINGS) IMPLANT
SPONGE GAUZE 4X4 12PLY STER LF (GAUZE/BANDAGES/DRESSINGS) IMPLANT
SPONGE LAP 18X18 X RAY DECT (DISPOSABLE) IMPLANT
STAPLER VISISTAT 35W (STAPLE) IMPLANT
STRIP CLOSURE SKIN 1/2X4 (GAUZE/BANDAGES/DRESSINGS) IMPLANT
STRIP CLOSURE SKIN 1/4X4 (GAUZE/BANDAGES/DRESSINGS) IMPLANT
SUCTION FRAZIER TIP 10 FR DISP (SUCTIONS) IMPLANT
SURGILUBE 2OZ TUBE FLIPTOP (MISCELLANEOUS) ×3 IMPLANT
SUT CHROMIC 4 0 PS 2 18 (SUTURE) ×3 IMPLANT
SUT MNCRL AB 4-0 PS2 18 (SUTURE) IMPLANT
SUT PLAIN 5 0 P 3 18 (SUTURE) IMPLANT
SUT PROLENE 5 0 P 3 (SUTURE) IMPLANT
SUT PROLENE 5 0 PS 2 (SUTURE) IMPLANT
SUT PROLENE 6 0 P 1 18 (SUTURE) IMPLANT
SUT SILK 4 0 P 3 (SUTURE) ×3 IMPLANT
SUT VIC AB 3-0 FS2 27 (SUTURE) IMPLANT
SUT VIC AB 5-0 PS2 18 (SUTURE) IMPLANT
SUT VICRYL 4-0 PS2 18IN ABS (SUTURE) IMPLANT
SYR BULB 3OZ (MISCELLANEOUS) IMPLANT
SYR BULB IRRIGATION 50ML (SYRINGE) IMPLANT
SYR CONTROL 10ML LL (SYRINGE) ×3 IMPLANT
TOWEL OR 17X24 6PK STRL BLUE (TOWEL DISPOSABLE) ×3 IMPLANT
TRAY DSU PREP LF (CUSTOM PROCEDURE TRAY) ×3 IMPLANT
TUBE CONNECTING 20'X1/4 (TUBING)
TUBE CONNECTING 20X1/4 (TUBING) IMPLANT
UNDERPAD 30X30 INCONTINENT (UNDERPADS AND DIAPERS) ×3 IMPLANT
YANKAUER SUCT BULB TIP NO VENT (SUCTIONS) IMPLANT

## 2014-05-13 NOTE — Discharge Instructions (Signed)
Call your surgeon if you experience:  1.  Fever over 101.0. 2.  Inability to urinate. 3.  Nausea and/or vomiting. 4.  Extreme swelling or bruising at the surgical site. 5.  Continued bleeding from the incision. 6.  Increased pain, redness or drainage from the incision. 7.  Problems related to your pain medication. 8. Any change in color, movement and/or sensation 9. Any problems and/or concerns   Post Anesthesia Home Care Instructions Activity: Get plenty of rest for the remainder of the day. A responsible adult should stay with you for 24 hours following the procedure.  For the next 24 hours, DO NOT: -Drive a car -Paediatric nurse -Drink alcoholic beverages -Take any medication unless instructed by your physician -Make any legal decisions or sign important papers.  Meals: Start with liquid foods such as gelatin or soup. Progress to regular foods as tolerated. Avoid greasy, spicy, heavy foods. If nausea and/or vomiting occur, drink only clear liquids until the nausea and/or vomiting subsides. Call your physician if vomiting continues.  Special Instructions/Symptoms: Your throat may feel dry or sore from the anesthesia or the breathing tube placed in your throat during surgery. If this causes discomfort, gargle with warm salt water. The discomfort should disappear within 24 hours.

## 2014-05-13 NOTE — Transfer of Care (Signed)
Immediate Anesthesia Transfer of Care Note  Patient: Ashley House  Procedure(s) Performed: Procedure(s): EXCISION MALIGNANT LESION RIGHT LEG 4 CM/APPLICATION OF ACCELL (Right) APPLICATION OF A-CELL OF EXTREMITY (Right)  Patient Location: PACU  Anesthesia Type:MAC  Level of Consciousness: sedated and patient cooperative  Airway & Oxygen Therapy: Patient Spontanous Breathing and Patient connected to face mask oxygen  Post-op Assessment: Report given to RN and Post -op Vital signs reviewed and stable  Post vital signs: Reviewed and stable  Last Vitals:  Filed Vitals:   05/13/14 0835  BP: 130/69  Pulse: 64  Temp: 36.8 C  Resp: 22    Complications: No apparent anesthesia complications

## 2014-05-13 NOTE — Anesthesia Postprocedure Evaluation (Signed)
  Anesthesia Post-op Note  Patient: Ashley House  Procedure(s) Performed: Procedure(s): EXCISION MALIGNANT LESION RIGHT LEG 4 CM/APPLICATION OF ACCELL (Right) APPLICATION OF A-CELL OF EXTREMITY (Right)  Patient Location: PACU  Anesthesia Type: MAC   Level of Consciousness: awake, alert  and oriented  Airway and Oxygen Therapy: Patient Spontanous Breathing  Post-op Pain: none   Post-op Assessment: Post-op Vital signs reviewed  Post-op Vital Signs: Reviewed  Last Vitals:  Filed Vitals:   05/13/14 1120  BP: 132/77  Pulse: 60  Temp: 36.8 C  Resp: 18    Complications: No apparent anesthesia complications

## 2014-05-13 NOTE — Anesthesia Preprocedure Evaluation (Signed)
Anesthesia Evaluation  Patient identified by MRN, date of birth, ID band Patient awake    Reviewed: Allergy & Precautions, NPO status , Patient's Chart, lab work & pertinent test results  Airway Mallampati: I  TM Distance: >3 FB Neck ROM: Full    Dental  (+) Edentulous Upper, Edentulous Lower, Dental Advisory Given   Pulmonary  breath sounds clear to auscultation        Cardiovascular hypertension, Pt. on medications Rhythm:Regular Rate:Normal     Neuro/Psych    GI/Hepatic PUD,   Endo/Other    Renal/GU      Musculoskeletal   Abdominal   Peds  Hematology   Anesthesia Other Findings   Reproductive/Obstetrics                             Anesthesia Physical Anesthesia Plan  ASA: II  Anesthesia Plan: MAC   Post-op Pain Management:    Induction: Intravenous  Airway Management Planned: Simple Face Mask  Additional Equipment:   Intra-op Plan:   Post-operative Plan:   Informed Consent: I have reviewed the patients History and Physical, chart, labs and discussed the procedure including the risks, benefits and alternatives for the proposed anesthesia with the patient or authorized representative who has indicated his/her understanding and acceptance.   Dental advisory given  Plan Discussed with: CRNA, Anesthesiologist and Surgeon  Anesthesia Plan Comments:         Anesthesia Quick Evaluation

## 2014-05-13 NOTE — Anesthesia Procedure Notes (Signed)
Procedure Name: MAC Date/Time: 05/13/2014 9:51 AM Performed by: Marrianne Mood Pre-anesthesia Checklist: Patient identified, Timeout performed, Emergency Drugs available, Suction available and Patient being monitored Patient Re-evaluated:Patient Re-evaluated prior to inductionOxygen Delivery Method: Simple face mask

## 2014-05-13 NOTE — Interval H&P Note (Signed)
History and Physical Interval Note:  05/13/2014 7:06 AM  Ashley House  has presented today for surgery, with the diagnosis of SQUAMOUS CELL RIGHT TIBIA  The various methods of treatment have been discussed with the patient and family. After consideration of risks, benefits and other options for treatment, the patient has consented to  Procedure(s): EXCISION MALIGNANT LESION RIGHT LEG 4 CM/APPLICATION OF ACCELL (Right) APPLICATION OF A-CELL OF EXTREMITY (Right) as a surgical intervention .  The patient's history has been reviewed, patient examined, no change in status, stable for surgery.  I have reviewed the patient's chart and labs.  Questions were answered to the patient's satisfaction.     Francena Zender

## 2014-05-13 NOTE — Op Note (Signed)
Operative Note   DATE OF OPERATION: 3.18.2016  LOCATION: [facility]-outpatient  SURGICAL DIVISION: Plastic Surgery  PREOPERATIVE DIAGNOSES:  1. Squamous cell carcinoma right leg 2. Venous stasis  POSTOPERATIVE DIAGNOSES:  same  PROCEDURE:  1. Excision malignant lesion 4cm right leg 2. Application A Cell matrix 12 cm2 to right leg   SURGEON: Irene Limbo MD MBA  ASSISTANT: none  ANESTHESIA:  MAC.   EBL: minimal  COMPLICATIONS: None immediate.   INDICATIONS FOR PROCEDURE:  The patient, Ashley House, is a 81 y.o. female born on 10-16-1918, is here for excision of biopsy proved right leg squamous cell carcinoma.    FINDINGS: large hypertrophic ulcerated mass right leg in setting edema and venous stasis  DESCRIPTION OF PROCEDURE:  The patient's operative site was marked with the patient in the preoperative area. The patient was taken to the operating room. SCDs were placed and IV antibiotics were given. The patient's operative site was prepped and draped in a sterile fashion. A time out was performed and all information was confirmed to be correct.  Local anesthetic infiltrated surrounding wound. Sharp excision of lesion with margins, diameter 4 cm. Specimen marked for pathology. Hemostasis obtained. A Cell matrix fenestrated and sewn to defect margins with 4-0 chromic. Adaptic loosely tacked to skin over wound with 4-0 silk. Surgical lubricant and dry dressing, Ace wrap applied.   The patient was allowed to wake from anesthesia, extubated and taken to the recovery room in satisfactory condition.   SPECIMENS: squamous cell right leg  DRAINS: none  Irene Limbo, MD [facility] Plastic & Reconstructive Surgery [phone]

## 2014-05-15 DIAGNOSIS — M150 Primary generalized (osteo)arthritis: Secondary | ICD-10-CM | POA: Diagnosis not present

## 2014-05-15 DIAGNOSIS — S81801A Unspecified open wound, right lower leg, initial encounter: Secondary | ICD-10-CM | POA: Diagnosis not present

## 2014-05-15 DIAGNOSIS — F0390 Unspecified dementia without behavioral disturbance: Secondary | ICD-10-CM | POA: Diagnosis not present

## 2014-05-15 DIAGNOSIS — C4490 Unspecified malignant neoplasm of skin, unspecified: Secondary | ICD-10-CM | POA: Diagnosis not present

## 2014-05-15 DIAGNOSIS — H3530 Unspecified macular degeneration: Secondary | ICD-10-CM | POA: Diagnosis not present

## 2014-05-15 DIAGNOSIS — I10 Essential (primary) hypertension: Secondary | ICD-10-CM | POA: Diagnosis not present

## 2014-05-16 ENCOUNTER — Encounter (HOSPITAL_BASED_OUTPATIENT_CLINIC_OR_DEPARTMENT_OTHER): Payer: Self-pay | Admitting: Plastic Surgery

## 2014-05-17 DIAGNOSIS — I10 Essential (primary) hypertension: Secondary | ICD-10-CM | POA: Diagnosis not present

## 2014-05-17 DIAGNOSIS — S81801A Unspecified open wound, right lower leg, initial encounter: Secondary | ICD-10-CM | POA: Diagnosis not present

## 2014-05-17 DIAGNOSIS — M150 Primary generalized (osteo)arthritis: Secondary | ICD-10-CM | POA: Diagnosis not present

## 2014-05-17 DIAGNOSIS — C4490 Unspecified malignant neoplasm of skin, unspecified: Secondary | ICD-10-CM | POA: Diagnosis not present

## 2014-05-17 DIAGNOSIS — F0390 Unspecified dementia without behavioral disturbance: Secondary | ICD-10-CM | POA: Diagnosis not present

## 2014-05-17 DIAGNOSIS — H3530 Unspecified macular degeneration: Secondary | ICD-10-CM | POA: Diagnosis not present

## 2014-05-18 DIAGNOSIS — S81009A Unspecified open wound, unspecified knee, initial encounter: Secondary | ICD-10-CM | POA: Diagnosis not present

## 2014-05-18 DIAGNOSIS — Z4801 Encounter for change or removal of surgical wound dressing: Secondary | ICD-10-CM | POA: Diagnosis not present

## 2014-05-23 DIAGNOSIS — C4490 Unspecified malignant neoplasm of skin, unspecified: Secondary | ICD-10-CM | POA: Diagnosis not present

## 2014-05-23 DIAGNOSIS — H3530 Unspecified macular degeneration: Secondary | ICD-10-CM | POA: Diagnosis not present

## 2014-05-23 DIAGNOSIS — S81801A Unspecified open wound, right lower leg, initial encounter: Secondary | ICD-10-CM | POA: Diagnosis not present

## 2014-05-23 DIAGNOSIS — M150 Primary generalized (osteo)arthritis: Secondary | ICD-10-CM | POA: Diagnosis not present

## 2014-05-23 DIAGNOSIS — F0390 Unspecified dementia without behavioral disturbance: Secondary | ICD-10-CM | POA: Diagnosis not present

## 2014-05-23 DIAGNOSIS — I10 Essential (primary) hypertension: Secondary | ICD-10-CM | POA: Diagnosis not present

## 2014-05-25 DIAGNOSIS — I10 Essential (primary) hypertension: Secondary | ICD-10-CM | POA: Diagnosis not present

## 2014-05-25 DIAGNOSIS — S81801A Unspecified open wound, right lower leg, initial encounter: Secondary | ICD-10-CM | POA: Diagnosis not present

## 2014-05-25 DIAGNOSIS — M150 Primary generalized (osteo)arthritis: Secondary | ICD-10-CM | POA: Diagnosis not present

## 2014-05-25 DIAGNOSIS — C4490 Unspecified malignant neoplasm of skin, unspecified: Secondary | ICD-10-CM | POA: Diagnosis not present

## 2014-05-25 DIAGNOSIS — H3530 Unspecified macular degeneration: Secondary | ICD-10-CM | POA: Diagnosis not present

## 2014-05-25 DIAGNOSIS — F0390 Unspecified dementia without behavioral disturbance: Secondary | ICD-10-CM | POA: Diagnosis not present

## 2014-05-27 DIAGNOSIS — F0390 Unspecified dementia without behavioral disturbance: Secondary | ICD-10-CM | POA: Diagnosis not present

## 2014-05-27 DIAGNOSIS — I10 Essential (primary) hypertension: Secondary | ICD-10-CM | POA: Diagnosis not present

## 2014-05-27 DIAGNOSIS — M150 Primary generalized (osteo)arthritis: Secondary | ICD-10-CM | POA: Diagnosis not present

## 2014-05-27 DIAGNOSIS — S81801A Unspecified open wound, right lower leg, initial encounter: Secondary | ICD-10-CM | POA: Diagnosis not present

## 2014-05-27 DIAGNOSIS — H3530 Unspecified macular degeneration: Secondary | ICD-10-CM | POA: Diagnosis not present

## 2014-05-27 DIAGNOSIS — C4490 Unspecified malignant neoplasm of skin, unspecified: Secondary | ICD-10-CM | POA: Diagnosis not present

## 2014-05-31 DIAGNOSIS — S81801A Unspecified open wound, right lower leg, initial encounter: Secondary | ICD-10-CM | POA: Diagnosis not present

## 2014-05-31 DIAGNOSIS — H3530 Unspecified macular degeneration: Secondary | ICD-10-CM | POA: Diagnosis not present

## 2014-05-31 DIAGNOSIS — C4490 Unspecified malignant neoplasm of skin, unspecified: Secondary | ICD-10-CM | POA: Diagnosis not present

## 2014-05-31 DIAGNOSIS — F0390 Unspecified dementia without behavioral disturbance: Secondary | ICD-10-CM | POA: Diagnosis not present

## 2014-05-31 DIAGNOSIS — M150 Primary generalized (osteo)arthritis: Secondary | ICD-10-CM | POA: Diagnosis not present

## 2014-05-31 DIAGNOSIS — I10 Essential (primary) hypertension: Secondary | ICD-10-CM | POA: Diagnosis not present

## 2014-06-01 DIAGNOSIS — S91001D Unspecified open wound, right ankle, subsequent encounter: Secondary | ICD-10-CM | POA: Diagnosis not present

## 2014-06-01 DIAGNOSIS — Z9889 Other specified postprocedural states: Secondary | ICD-10-CM | POA: Diagnosis not present

## 2014-06-01 DIAGNOSIS — S81801D Unspecified open wound, right lower leg, subsequent encounter: Secondary | ICD-10-CM | POA: Diagnosis not present

## 2014-06-01 DIAGNOSIS — S81001D Unspecified open wound, right knee, subsequent encounter: Secondary | ICD-10-CM | POA: Diagnosis not present

## 2014-06-03 ENCOUNTER — Ambulatory Visit: Payer: Self-pay | Admitting: Family Medicine

## 2014-06-03 DIAGNOSIS — M150 Primary generalized (osteo)arthritis: Secondary | ICD-10-CM | POA: Diagnosis not present

## 2014-06-03 DIAGNOSIS — I10 Essential (primary) hypertension: Secondary | ICD-10-CM | POA: Diagnosis not present

## 2014-06-03 DIAGNOSIS — S81801A Unspecified open wound, right lower leg, initial encounter: Secondary | ICD-10-CM | POA: Diagnosis not present

## 2014-06-03 DIAGNOSIS — F0390 Unspecified dementia without behavioral disturbance: Secondary | ICD-10-CM | POA: Diagnosis not present

## 2014-06-03 DIAGNOSIS — H3530 Unspecified macular degeneration: Secondary | ICD-10-CM | POA: Diagnosis not present

## 2014-06-03 DIAGNOSIS — C4490 Unspecified malignant neoplasm of skin, unspecified: Secondary | ICD-10-CM | POA: Diagnosis not present

## 2014-06-06 DIAGNOSIS — C4490 Unspecified malignant neoplasm of skin, unspecified: Secondary | ICD-10-CM | POA: Diagnosis not present

## 2014-06-06 DIAGNOSIS — F0390 Unspecified dementia without behavioral disturbance: Secondary | ICD-10-CM | POA: Diagnosis not present

## 2014-06-06 DIAGNOSIS — S81009A Unspecified open wound, unspecified knee, initial encounter: Secondary | ICD-10-CM | POA: Diagnosis not present

## 2014-06-06 DIAGNOSIS — M150 Primary generalized (osteo)arthritis: Secondary | ICD-10-CM | POA: Diagnosis not present

## 2014-06-06 DIAGNOSIS — S81801A Unspecified open wound, right lower leg, initial encounter: Secondary | ICD-10-CM | POA: Diagnosis not present

## 2014-06-06 DIAGNOSIS — Z4801 Encounter for change or removal of surgical wound dressing: Secondary | ICD-10-CM | POA: Diagnosis not present

## 2014-06-06 DIAGNOSIS — H3530 Unspecified macular degeneration: Secondary | ICD-10-CM | POA: Diagnosis not present

## 2014-06-06 DIAGNOSIS — I10 Essential (primary) hypertension: Secondary | ICD-10-CM | POA: Diagnosis not present

## 2014-06-07 ENCOUNTER — Ambulatory Visit: Payer: Self-pay | Admitting: Family Medicine

## 2014-06-08 DIAGNOSIS — F0390 Unspecified dementia without behavioral disturbance: Secondary | ICD-10-CM | POA: Diagnosis not present

## 2014-06-08 DIAGNOSIS — C4490 Unspecified malignant neoplasm of skin, unspecified: Secondary | ICD-10-CM | POA: Diagnosis not present

## 2014-06-08 DIAGNOSIS — M150 Primary generalized (osteo)arthritis: Secondary | ICD-10-CM | POA: Diagnosis not present

## 2014-06-08 DIAGNOSIS — I10 Essential (primary) hypertension: Secondary | ICD-10-CM | POA: Diagnosis not present

## 2014-06-08 DIAGNOSIS — S81801A Unspecified open wound, right lower leg, initial encounter: Secondary | ICD-10-CM | POA: Diagnosis not present

## 2014-06-08 DIAGNOSIS — H3530 Unspecified macular degeneration: Secondary | ICD-10-CM | POA: Diagnosis not present

## 2014-06-10 ENCOUNTER — Telehealth: Payer: Self-pay | Admitting: Family Medicine

## 2014-06-10 DIAGNOSIS — C4490 Unspecified malignant neoplasm of skin, unspecified: Secondary | ICD-10-CM | POA: Diagnosis not present

## 2014-06-10 DIAGNOSIS — H3530 Unspecified macular degeneration: Secondary | ICD-10-CM | POA: Diagnosis not present

## 2014-06-10 DIAGNOSIS — I10 Essential (primary) hypertension: Secondary | ICD-10-CM | POA: Diagnosis not present

## 2014-06-10 DIAGNOSIS — F0390 Unspecified dementia without behavioral disturbance: Secondary | ICD-10-CM | POA: Diagnosis not present

## 2014-06-10 DIAGNOSIS — S81801A Unspecified open wound, right lower leg, initial encounter: Secondary | ICD-10-CM | POA: Diagnosis not present

## 2014-06-10 DIAGNOSIS — M150 Primary generalized (osteo)arthritis: Secondary | ICD-10-CM | POA: Diagnosis not present

## 2014-06-10 NOTE — Telephone Encounter (Signed)
Tried to call Ashley House to give her lab order but no answer and voicemail was full.  Ashley House also not available.  Left message for her to call back regarding lab request.

## 2014-06-10 NOTE — Telephone Encounter (Signed)
Labs: lipid panel, TSH for essential HTN  I10 Vit D level for dx of D def E55.9 B12 level for B12 def E53.8    If they do not want a lipid prof in light of her age that is up to them  She had a recent chem prof and Hb -so those do not need to be done   We can put off appt until she is more mobile   If the home care agency orders via [facility]-please put in the orders so they flow into epic Thanks

## 2014-06-10 NOTE — Telephone Encounter (Signed)
Spoke with Jacqlyn Larsen, advised her of Dr Glori Bickers recommendation.  She will have Levada Dy call for lab orders.

## 2014-06-10 NOTE — Telephone Encounter (Signed)
Pts daughter Ashley House called stating that the [facility] nurse is coming out to the house 3 times a week to check the places on pt's leg where she had skin cancer removed. The appt on Monday she is scheduled for is a f/u to recheck her blood work and levels. Ashley House said the home health nurse can draw the blood there at the house but she will need to have verbal orders and/or fax orders. Ashley House does not remember who the home health agency is but thinks it might be out of [place]. The nurse is coming out this Monday. Please contact Ashley House at [phone] if she can draw blood work at the house and advise on what needs to be drawn. Ashley House states it is really hard to get her mom in and out of the house. We will need to cancel appt on Monday at 1130 if agreeable. Please advise   Dr. Irene Limbo is the doc who did her surgery on her leg and they are the ones who set up home health for pt.

## 2014-06-13 ENCOUNTER — Telehealth: Payer: Self-pay | Admitting: Family Medicine

## 2014-06-13 ENCOUNTER — Ambulatory Visit: Payer: Self-pay | Admitting: Family Medicine

## 2014-06-13 DIAGNOSIS — M150 Primary generalized (osteo)arthritis: Secondary | ICD-10-CM | POA: Diagnosis not present

## 2014-06-13 DIAGNOSIS — C4490 Unspecified malignant neoplasm of skin, unspecified: Secondary | ICD-10-CM | POA: Diagnosis not present

## 2014-06-13 DIAGNOSIS — I10 Essential (primary) hypertension: Secondary | ICD-10-CM | POA: Diagnosis not present

## 2014-06-13 DIAGNOSIS — H3530 Unspecified macular degeneration: Secondary | ICD-10-CM | POA: Diagnosis not present

## 2014-06-13 DIAGNOSIS — F0390 Unspecified dementia without behavioral disturbance: Secondary | ICD-10-CM | POA: Diagnosis not present

## 2014-06-13 DIAGNOSIS — S81801A Unspecified open wound, right lower leg, initial encounter: Secondary | ICD-10-CM | POA: Diagnosis not present

## 2014-06-13 NOTE — Telephone Encounter (Signed)
Patient did not come in for their appointment today for follow up.  Please let me know if patient needs to be contacted immediately for follow up or no follow up needed.

## 2014-06-15 DIAGNOSIS — I10 Essential (primary) hypertension: Secondary | ICD-10-CM | POA: Diagnosis not present

## 2014-06-15 DIAGNOSIS — H3530 Unspecified macular degeneration: Secondary | ICD-10-CM | POA: Diagnosis not present

## 2014-06-15 DIAGNOSIS — F0390 Unspecified dementia without behavioral disturbance: Secondary | ICD-10-CM | POA: Diagnosis not present

## 2014-06-15 DIAGNOSIS — S81801A Unspecified open wound, right lower leg, initial encounter: Secondary | ICD-10-CM | POA: Diagnosis not present

## 2014-06-15 DIAGNOSIS — C4490 Unspecified malignant neoplasm of skin, unspecified: Secondary | ICD-10-CM | POA: Diagnosis not present

## 2014-06-15 DIAGNOSIS — M150 Primary generalized (osteo)arthritis: Secondary | ICD-10-CM | POA: Diagnosis not present

## 2014-06-16 ENCOUNTER — Telehealth: Payer: Self-pay

## 2014-06-16 DIAGNOSIS — I10 Essential (primary) hypertension: Secondary | ICD-10-CM

## 2014-06-16 DIAGNOSIS — E7800 Pure hypercholesterolemia, unspecified: Secondary | ICD-10-CM

## 2014-06-16 DIAGNOSIS — E538 Deficiency of other specified B group vitamins: Secondary | ICD-10-CM

## 2014-06-16 DIAGNOSIS — M810 Age-related osteoporosis without current pathological fracture: Secondary | ICD-10-CM

## 2014-06-16 NOTE — Telephone Encounter (Signed)
Left voicemail asking Levada Dy if we need to do labs through [facility] or another lab or do they just do verbal orders?

## 2014-06-16 NOTE — Telephone Encounter (Signed)
Please ask if they send their labs to [facility] so I know if I need to order it thru them-thanks

## 2014-06-16 NOTE — Telephone Encounter (Signed)
Ashley House with [facility] left v/m; pt's daughter thinks time for pt to have lab testing done and angela can draw at home if Dr Glori Bickers would like; angela request cb with lab order if Dr Glori Bickers wants her to draw labs.

## 2014-06-17 DIAGNOSIS — F0390 Unspecified dementia without behavioral disturbance: Secondary | ICD-10-CM | POA: Diagnosis not present

## 2014-06-17 DIAGNOSIS — C4490 Unspecified malignant neoplasm of skin, unspecified: Secondary | ICD-10-CM | POA: Diagnosis not present

## 2014-06-17 DIAGNOSIS — S81801A Unspecified open wound, right lower leg, initial encounter: Secondary | ICD-10-CM | POA: Diagnosis not present

## 2014-06-17 DIAGNOSIS — M150 Primary generalized (osteo)arthritis: Secondary | ICD-10-CM | POA: Diagnosis not present

## 2014-06-17 DIAGNOSIS — H3530 Unspecified macular degeneration: Secondary | ICD-10-CM | POA: Diagnosis not present

## 2014-06-17 DIAGNOSIS — I10 Essential (primary) hypertension: Secondary | ICD-10-CM | POA: Diagnosis not present

## 2014-06-17 NOTE — Telephone Encounter (Signed)
Please order cmet, cbc with diff, vit D level, lipid panel  Dx HTN, osteoporosis, D def, B12 def and hypercholesterolemia  Thanks

## 2014-06-17 NOTE — Telephone Encounter (Signed)
Left voicemail on Ashley House's phone with lab orders

## 2014-06-17 NOTE — Telephone Encounter (Signed)
Levada Dy said that she can take verbal orders over the phone, you don't have to put them in the computer she just needs to know what labs to do

## 2014-06-20 DIAGNOSIS — F0390 Unspecified dementia without behavioral disturbance: Secondary | ICD-10-CM | POA: Diagnosis not present

## 2014-06-20 DIAGNOSIS — M150 Primary generalized (osteo)arthritis: Secondary | ICD-10-CM | POA: Diagnosis not present

## 2014-06-20 DIAGNOSIS — S81801A Unspecified open wound, right lower leg, initial encounter: Secondary | ICD-10-CM | POA: Diagnosis not present

## 2014-06-20 DIAGNOSIS — I10 Essential (primary) hypertension: Secondary | ICD-10-CM | POA: Diagnosis not present

## 2014-06-20 DIAGNOSIS — H3530 Unspecified macular degeneration: Secondary | ICD-10-CM | POA: Diagnosis not present

## 2014-06-20 DIAGNOSIS — C4490 Unspecified malignant neoplasm of skin, unspecified: Secondary | ICD-10-CM | POA: Diagnosis not present

## 2014-06-22 DIAGNOSIS — S91001D Unspecified open wound, right ankle, subsequent encounter: Secondary | ICD-10-CM | POA: Diagnosis not present

## 2014-06-22 DIAGNOSIS — S81801D Unspecified open wound, right lower leg, subsequent encounter: Secondary | ICD-10-CM | POA: Diagnosis not present

## 2014-06-22 DIAGNOSIS — Z9889 Other specified postprocedural states: Secondary | ICD-10-CM | POA: Diagnosis not present

## 2014-06-22 DIAGNOSIS — S81001D Unspecified open wound, right knee, subsequent encounter: Secondary | ICD-10-CM | POA: Diagnosis not present

## 2014-06-24 DIAGNOSIS — H3530 Unspecified macular degeneration: Secondary | ICD-10-CM | POA: Diagnosis not present

## 2014-06-24 DIAGNOSIS — C4490 Unspecified malignant neoplasm of skin, unspecified: Secondary | ICD-10-CM | POA: Diagnosis not present

## 2014-06-24 DIAGNOSIS — F0390 Unspecified dementia without behavioral disturbance: Secondary | ICD-10-CM | POA: Diagnosis not present

## 2014-06-24 DIAGNOSIS — S81801A Unspecified open wound, right lower leg, initial encounter: Secondary | ICD-10-CM | POA: Diagnosis not present

## 2014-06-24 DIAGNOSIS — I10 Essential (primary) hypertension: Secondary | ICD-10-CM | POA: Diagnosis not present

## 2014-06-24 DIAGNOSIS — M150 Primary generalized (osteo)arthritis: Secondary | ICD-10-CM | POA: Diagnosis not present

## 2014-06-27 DIAGNOSIS — H3530 Unspecified macular degeneration: Secondary | ICD-10-CM | POA: Diagnosis not present

## 2014-06-27 DIAGNOSIS — M150 Primary generalized (osteo)arthritis: Secondary | ICD-10-CM | POA: Diagnosis not present

## 2014-06-27 DIAGNOSIS — S81801A Unspecified open wound, right lower leg, initial encounter: Secondary | ICD-10-CM | POA: Diagnosis not present

## 2014-06-27 DIAGNOSIS — I10 Essential (primary) hypertension: Secondary | ICD-10-CM | POA: Diagnosis not present

## 2014-06-27 DIAGNOSIS — C4490 Unspecified malignant neoplasm of skin, unspecified: Secondary | ICD-10-CM | POA: Diagnosis not present

## 2014-06-27 DIAGNOSIS — F0390 Unspecified dementia without behavioral disturbance: Secondary | ICD-10-CM | POA: Diagnosis not present

## 2014-06-29 DIAGNOSIS — E559 Vitamin D deficiency, unspecified: Secondary | ICD-10-CM | POA: Diagnosis not present

## 2014-06-29 DIAGNOSIS — C4490 Unspecified malignant neoplasm of skin, unspecified: Secondary | ICD-10-CM | POA: Diagnosis not present

## 2014-06-29 DIAGNOSIS — D519 Vitamin B12 deficiency anemia, unspecified: Secondary | ICD-10-CM | POA: Diagnosis not present

## 2014-06-29 DIAGNOSIS — I10 Essential (primary) hypertension: Secondary | ICD-10-CM | POA: Diagnosis not present

## 2014-06-29 DIAGNOSIS — H3530 Unspecified macular degeneration: Secondary | ICD-10-CM | POA: Diagnosis not present

## 2014-06-29 DIAGNOSIS — F0390 Unspecified dementia without behavioral disturbance: Secondary | ICD-10-CM | POA: Diagnosis not present

## 2014-06-29 DIAGNOSIS — M150 Primary generalized (osteo)arthritis: Secondary | ICD-10-CM | POA: Diagnosis not present

## 2014-06-29 DIAGNOSIS — S81801A Unspecified open wound, right lower leg, initial encounter: Secondary | ICD-10-CM | POA: Diagnosis not present

## 2014-06-29 DIAGNOSIS — E780 Pure hypercholesterolemia: Secondary | ICD-10-CM | POA: Diagnosis not present

## 2014-07-01 DIAGNOSIS — H3530 Unspecified macular degeneration: Secondary | ICD-10-CM | POA: Diagnosis not present

## 2014-07-01 DIAGNOSIS — M150 Primary generalized (osteo)arthritis: Secondary | ICD-10-CM | POA: Diagnosis not present

## 2014-07-01 DIAGNOSIS — F0390 Unspecified dementia without behavioral disturbance: Secondary | ICD-10-CM | POA: Diagnosis not present

## 2014-07-01 DIAGNOSIS — S81801A Unspecified open wound, right lower leg, initial encounter: Secondary | ICD-10-CM | POA: Diagnosis not present

## 2014-07-01 DIAGNOSIS — C4490 Unspecified malignant neoplasm of skin, unspecified: Secondary | ICD-10-CM | POA: Diagnosis not present

## 2014-07-01 DIAGNOSIS — I10 Essential (primary) hypertension: Secondary | ICD-10-CM | POA: Diagnosis not present

## 2014-07-04 DIAGNOSIS — S81801A Unspecified open wound, right lower leg, initial encounter: Secondary | ICD-10-CM | POA: Diagnosis not present

## 2014-07-04 DIAGNOSIS — I10 Essential (primary) hypertension: Secondary | ICD-10-CM | POA: Diagnosis not present

## 2014-07-04 DIAGNOSIS — F0390 Unspecified dementia without behavioral disturbance: Secondary | ICD-10-CM | POA: Diagnosis not present

## 2014-07-04 DIAGNOSIS — M150 Primary generalized (osteo)arthritis: Secondary | ICD-10-CM | POA: Diagnosis not present

## 2014-07-04 DIAGNOSIS — H3530 Unspecified macular degeneration: Secondary | ICD-10-CM | POA: Diagnosis not present

## 2014-07-04 DIAGNOSIS — C4490 Unspecified malignant neoplasm of skin, unspecified: Secondary | ICD-10-CM | POA: Diagnosis not present

## 2014-07-06 DIAGNOSIS — M150 Primary generalized (osteo)arthritis: Secondary | ICD-10-CM | POA: Diagnosis not present

## 2014-07-06 DIAGNOSIS — I10 Essential (primary) hypertension: Secondary | ICD-10-CM | POA: Diagnosis not present

## 2014-07-06 DIAGNOSIS — S81801A Unspecified open wound, right lower leg, initial encounter: Secondary | ICD-10-CM | POA: Diagnosis not present

## 2014-07-06 DIAGNOSIS — C4490 Unspecified malignant neoplasm of skin, unspecified: Secondary | ICD-10-CM | POA: Diagnosis not present

## 2014-07-06 DIAGNOSIS — F0390 Unspecified dementia without behavioral disturbance: Secondary | ICD-10-CM | POA: Diagnosis not present

## 2014-07-06 DIAGNOSIS — H3530 Unspecified macular degeneration: Secondary | ICD-10-CM | POA: Diagnosis not present

## 2014-07-07 ENCOUNTER — Telehealth: Payer: Self-pay | Admitting: *Deleted

## 2014-07-07 NOTE — Telephone Encounter (Signed)
Pt's daughter wanted to let you know that the Home health nurse came out yesterday and drew a vitamin D and b12 level and we should be getting the results soon. Daughter wanted to ask you based on the results if it would be okay to increase pt's b12 orally and stop the inj because pt is so small she has almost no muscle for pt's daughter to give her the b12 injections. Daughter wants you to review her labs when you get them and let her know if that's okay

## 2014-07-08 DIAGNOSIS — M150 Primary generalized (osteo)arthritis: Secondary | ICD-10-CM | POA: Diagnosis not present

## 2014-07-08 DIAGNOSIS — F0390 Unspecified dementia without behavioral disturbance: Secondary | ICD-10-CM | POA: Diagnosis not present

## 2014-07-08 DIAGNOSIS — C4490 Unspecified malignant neoplasm of skin, unspecified: Secondary | ICD-10-CM | POA: Diagnosis not present

## 2014-07-08 DIAGNOSIS — S81801A Unspecified open wound, right lower leg, initial encounter: Secondary | ICD-10-CM | POA: Diagnosis not present

## 2014-07-08 DIAGNOSIS — I10 Essential (primary) hypertension: Secondary | ICD-10-CM | POA: Diagnosis not present

## 2014-07-08 DIAGNOSIS — H3530 Unspecified macular degeneration: Secondary | ICD-10-CM | POA: Diagnosis not present

## 2014-07-11 DIAGNOSIS — S81801A Unspecified open wound, right lower leg, initial encounter: Secondary | ICD-10-CM | POA: Diagnosis not present

## 2014-07-11 DIAGNOSIS — I10 Essential (primary) hypertension: Secondary | ICD-10-CM | POA: Diagnosis not present

## 2014-07-11 DIAGNOSIS — H3530 Unspecified macular degeneration: Secondary | ICD-10-CM | POA: Diagnosis not present

## 2014-07-11 DIAGNOSIS — M150 Primary generalized (osteo)arthritis: Secondary | ICD-10-CM | POA: Diagnosis not present

## 2014-07-11 DIAGNOSIS — F0390 Unspecified dementia without behavioral disturbance: Secondary | ICD-10-CM | POA: Diagnosis not present

## 2014-07-11 DIAGNOSIS — C4490 Unspecified malignant neoplasm of skin, unspecified: Secondary | ICD-10-CM | POA: Diagnosis not present

## 2014-07-13 DIAGNOSIS — S81801D Unspecified open wound, right lower leg, subsequent encounter: Secondary | ICD-10-CM | POA: Diagnosis not present

## 2014-07-13 DIAGNOSIS — Z9889 Other specified postprocedural states: Secondary | ICD-10-CM | POA: Diagnosis not present

## 2014-07-13 DIAGNOSIS — S81001D Unspecified open wound, right knee, subsequent encounter: Secondary | ICD-10-CM | POA: Diagnosis not present

## 2014-07-13 DIAGNOSIS — S91001D Unspecified open wound, right ankle, subsequent encounter: Secondary | ICD-10-CM | POA: Diagnosis not present

## 2014-07-14 ENCOUNTER — Encounter: Payer: Self-pay | Admitting: Family Medicine

## 2014-07-19 NOTE — Telephone Encounter (Signed)
That sounds fine, I will see them then

## 2014-07-19 NOTE — Telephone Encounter (Signed)
Daughter advised me that home health didn't label the tubs correctly so they couldn't do the vitamin D and b12 levels. Also her home health is done and doesn't come out to see pt anymore so daughter scheduled a f/u with you on 07/26/14 to have labs done here to check her b12 and vitamin d level, and also discuss pt's dementia, and pt isn't taking her medications correctly so she wanted to talk to you about that too. I scheduled a 30 min appt.

## 2014-07-19 NOTE — Telephone Encounter (Signed)
Please let her know I still do not have results - will keep watching for them

## 2014-07-26 ENCOUNTER — Ambulatory Visit (INDEPENDENT_AMBULATORY_CARE_PROVIDER_SITE_OTHER): Payer: Medicare Other | Admitting: Family Medicine

## 2014-07-26 ENCOUNTER — Encounter: Payer: Self-pay | Admitting: Family Medicine

## 2014-07-26 VITALS — BP 132/80 | HR 62 | Temp 98.2°F | Ht 62.0 in | Wt 118.5 lb

## 2014-07-26 DIAGNOSIS — F0391 Unspecified dementia with behavioral disturbance: Secondary | ICD-10-CM

## 2014-07-26 DIAGNOSIS — E559 Vitamin D deficiency, unspecified: Secondary | ICD-10-CM

## 2014-07-26 DIAGNOSIS — H6123 Impacted cerumen, bilateral: Secondary | ICD-10-CM

## 2014-07-26 DIAGNOSIS — I10 Essential (primary) hypertension: Secondary | ICD-10-CM

## 2014-07-26 DIAGNOSIS — H6120 Impacted cerumen, unspecified ear: Secondary | ICD-10-CM | POA: Insufficient documentation

## 2014-07-26 DIAGNOSIS — E538 Deficiency of other specified B group vitamins: Secondary | ICD-10-CM

## 2014-07-26 DIAGNOSIS — F03918 Unspecified dementia, unspecified severity, with other behavioral disturbance: Secondary | ICD-10-CM

## 2014-07-26 LAB — VITAMIN D 25 HYDROXY (VIT D DEFICIENCY, FRACTURES): VITD: 24.99 ng/mL — ABNORMAL LOW (ref 30.00–100.00)

## 2014-07-26 LAB — VITAMIN B12: Vitamin B-12: 412 pg/mL (ref 211–911)

## 2014-07-26 NOTE — Patient Instructions (Signed)
Labs today for D and B12 levels  Get debrox (solution for ear wax) over the counter - use as directed  Encourage use of a walker    Ashley House from [facility] is a good reference to have for dementia care /counseling If you would like to meet with her (it is free) her number is [phone]   Encourage regular meals and fluids  Current labs are stable

## 2014-07-26 NOTE — Assessment & Plan Note (Signed)
B12 level today  Trouble getting injections- arm has little muscle to it / can't get at pharmacy and getting pt here regularly is problematic  May be able to start back on her otc B12  Pending result to advise

## 2014-07-26 NOTE — Assessment & Plan Note (Signed)
Overall progressive  Pt has very poor short term memory Still at home with husband who tries to care for her  She refuses med at times and refuses use of a walker- problematic since she is legally blind and falls Urged family to give thought to safe living facility in the future (unsure how long she will be safe at home Pt refuses medication for dementia Given # of counselor with [facility] who does consultations to families of pt with dementia -may be helpful

## 2014-07-26 NOTE — Assessment & Plan Note (Signed)
Has been off D for 3-4 wk Has OP but no fractures (despite falls) Level today and adv

## 2014-07-26 NOTE — Assessment & Plan Note (Signed)
bp in fair control at this time  BP Readings from Last 1 Encounters:  07/26/14 132/80   No changes needed Disc lifstyle change with low sodium diet and exercise  Rev cmet and lipids drawn by home care and they are in [place] limits

## 2014-07-26 NOTE — Progress Notes (Signed)
Subjective:    Patient ID: Ashley House, female    DOB: 1918-06-15, 81 y.o.   MRN: [id number]  HPI Here for f/u of chronic medical problems   Has hx of B12 def Not on oral B12  Was prev on shot until it came in short supply (and had to come over to [facility]) Last one 2 mo ago  Not a lot of muscle in arm to give the shot-difficult  She was taking B12 orally 1200 mg daily (last dose 2-3 wk ago)   Due for D level  Also stopped that 2-3 wk ago waiting on results (then home health lost the specimen)     Wt is up 1 lb  bmi is 21  Appetite is fair-eats several times per day  Eats lunch and then grazes  Likes sweets but also gets enough protein   Chemistry labs look good -nl levels   Cholesterol  Total 163 triglyc 114 HDL 47 LDL 87  Excellent control  On pravastatin   bp in fair control at this time  BP Readings from Last 1 Encounters:  07/26/14 132/80   No changes needed Disc lifstyle change with low sodium diet and exercise      Had a large skin cancer removed from leg - went to plastic surgeon for that  It healed faster than anticipated (she tends to heal fast)    Mood is all over the place  Memory is getting worse  Hearing is worse lately also -? Wax   She declines any med for dementia (will not take pills at night)   Patient Active Problem List   Diagnosis Date Noted  . Skin lesion 04/14/2014  . Dark stools 01/12/2013  . Hypokalemia 09/01/2012  . Frequent falls 08/05/2012  . Skin tear 08/05/2012  . Vitamin D deficiency 04/02/2010  . ANXIETY DISORDER 04/02/2010  . B12 deficiency 11/09/2009  . Senile dementia with behavioral disturbance 10/09/2009  . FREQUENCY, URINARY 12/11/2007  . ANEMIA 05/27/2007  . Left sided ulcerative (chronic) colitis 03/06/2007  . MACULAR DEGENERATION 03/05/2007  . ALLERGIC RHINITIS 03/05/2007  . DIVERTICULOSIS, COLON 03/05/2007  . UTI'S, RECURRENT 03/05/2007  . OSTEOARTHRITIS 03/05/2007  . Osteoporosis 03/05/2007  .  External hemorrhoids without mention of complication 40/09/6759  . HYPERCHOLESTEROLEMIA, PURE 11/26/2006  . HYPERTENSION, BENIGN ESSENTIAL 11/26/2006   Past Medical History  Diagnosis Date  . Allergy   . Hypertension   . Osteoporosis   . Anemia   . Diverticulosis   . Gastritis   . Edema   . Macular degeneration   . Overactive bladder   . DDD (degenerative disc disease)   . Dementia, senile   . Full dentures   . Macular degeneration     no center vision   Past Surgical History  Procedure Laterality Date  . Cataract extraction    . Colonoscopy    . Lesion removal Right 05/13/2014    Procedure: EXCISION MALIGNANT LESION RIGHT LEG 4 CM/APPLICATION OF ACCELL;  Surgeon: Irene Limbo, MD;  Location: [place];  Service: Plastics;  Laterality: Right;  . Application of a-cell of extremity Right 05/13/2014    Procedure: APPLICATION OF A-CELL OF EXTREMITY;  Surgeon: Irene Limbo, MD;  Location: [place];  Service: Plastics;  Laterality: Right;   History  Substance Use Topics  . Smoking status: Never Smoker   . Smokeless tobacco: Current User    Types: Snuff  . Alcohol Use: No   Family History  Problem  Relation Age of Onset  . Kidney disease Mother   . Cancer Brother     colon   Allergies  Allergen Reactions  . Ace Inhibitors   . Atorvastatin   . Ezetimibe-Simvastatin   . Guanfacine Hcl   . Tolterodine Tartrate     REACTION: does not work   Current Outpatient Prescriptions on File Prior to Visit  Medication Sig Dispense Refill  . amLODipine (NORVASC) 5 MG tablet Take 1 tablet by mouth  daily 90 tablet 1  . Cholecalciferol (VITAMIN D) 2000 UNITS CAPS Take 1 capsule by mouth daily.     Marland Kitchen HYDROcodone-acetaminophen (NORCO) 5-325 MG per tablet Take 1 tablet by mouth every 6 (six) hours as needed for moderate pain. 30 tablet 0  . pravastatin (PRAVACHOL) 40 MG tablet Take 1 tablet by mouth  daily 90 tablet 1   No current  facility-administered medications on file prior to visit.         Review of Systems Review of Systems  Constitutional: Negative for fever, appetite change, fatigue and unexpected weight change.  Eyes: Negative for pain and visual disturbance.  Respiratory: Negative for cough and shortness of breath.   Cardiovascular: Negative for cp or palpitations    Gastrointestinal: Negative for nausea, diarrhea and constipation.  Genitourinary: Negative for urgency and frequency.  Skin: Negative for pallor or rash   Neurological: Negative for weakness, light-headedness, numbness and headaches. pos for frequent falls  Hematological: Negative for adenopathy. Does not bruise/bleed easily.  Psychiatric/Behavioral: pos for dementia with labile mood and poor sleep at night        Objective:   Physical Exam  Constitutional: She appears well-developed and well-nourished. No distress.  Frail app elderly female  HENT:  Head: Normocephalic and atraumatic.  Nose: Nose normal.  Mouth/Throat: Oropharynx is clear and moist.  bilat mild /soft cerumen impaction -not total   Eyes: Conjunctivae and EOM are normal. Pupils are equal, round, and reactive to light. Right eye exhibits no discharge. Left eye exhibits no discharge. No scleral icterus.  Poor vision/legally blind   Neck: Normal range of motion. Neck supple. No JVD present. Carotid bruit is not present. No thyromegaly present.  Cardiovascular: Normal rate, regular rhythm, normal heart sounds and intact distal pulses.  Exam reveals no gallop.   Pulmonary/Chest: Effort normal and breath sounds normal. No respiratory distress. She has no wheezes. She has no rales.  Abdominal: Soft. Bowel sounds are normal. She exhibits no distension and no mass. There is no tenderness.  Musculoskeletal: She exhibits no edema or tenderness.  Kyphosis   Lymphadenopathy:    She has no cervical adenopathy.  Neurological: She is alert. She has normal reflexes. No cranial  nerve deficit. She exhibits normal muscle tone. Coordination normal.  Needs help with ambulation- she refuses walker or cane  Skin: Skin is warm and dry. No rash noted. No erythema. No pallor.  Thin skin Healing area from recent squamous cell ca surg on R leg- looks healthy  Psychiatric:  Dementia- pt repeats herself frequently but in good spirits           Assessment & Plan:   Problem List Items Addressed This Visit    B12 deficiency    B12 level today  Trouble getting injections- arm has little muscle to it / can't get at pharmacy and getting pt here regularly is problematic  May be able to start back on her otc B12  Pending result to advise      Relevant  Orders   Vitamin B12 (Completed)   Cerumen impaction    Do not know if pt with dementia would tolerate irrigation Adv trial of debrox at home to see how she does and update       HYPERTENSION, BENIGN ESSENTIAL - Primary    bp in fair control at this time  BP Readings from Last 1 Encounters:  07/26/14 132/80   No changes needed Disc lifstyle change with low sodium diet and exercise  Rev cmet and lipids drawn by home care and they are in nl limits        Senile dementia with behavioral disturbance    Overall progressive  Pt has very poor short term memory Still at home with husband who tries to care for her  She refuses med at times and refuses use of a walker- problematic since she is legally blind and falls Urged family to give thought to safe living facility in the future (unsure how long she will be safe at home Pt refuses medication for dementia Given # of counselor with [facility] who does consultations to families of pt with dementia -may be helpful       Vitamin D deficiency    Has been off D for 3-4 wk Has OP but no fractures (despite falls) Level today and adv      Relevant Orders   Vit D  25 hydroxy (rtn osteoporosis monitoring) (Completed)

## 2014-07-26 NOTE — Assessment & Plan Note (Signed)
Do not know if pt with dementia would tolerate irrigation Adv trial of debrox at home to see how she does and update

## 2014-07-26 NOTE — Progress Notes (Signed)
Pre visit review using our clinic review tool, if applicable. No additional management support is needed unless otherwise documented below in the visit note. 

## 2014-07-27 ENCOUNTER — Encounter: Payer: Self-pay | Admitting: *Deleted

## 2014-08-17 ENCOUNTER — Ambulatory Visit (INDEPENDENT_AMBULATORY_CARE_PROVIDER_SITE_OTHER): Payer: Medicare Other | Admitting: Family Medicine

## 2014-08-17 ENCOUNTER — Encounter: Payer: Self-pay | Admitting: Family Medicine

## 2014-08-17 VITALS — BP 120/64 | HR 84 | Temp 98.6°F | Wt 116.5 lb

## 2014-08-17 DIAGNOSIS — H6123 Impacted cerumen, bilateral: Secondary | ICD-10-CM

## 2014-08-17 NOTE — Patient Instructions (Signed)
Take care.  Glad to see you.

## 2014-08-17 NOTE — Assessment & Plan Note (Signed)
Irrigated B and curette used on L ear.   No complications.  Recheck TMs wnl, minimal canal irritation but no overt infection.  Hearing improved.   Fu prn.

## 2014-08-17 NOTE — Progress Notes (Signed)
Pre visit review using our clinic review tool, if applicable. No additional management support is needed unless otherwise documented below in the visit note.  Recently noted B dec in hearing.  Patient with memory loss at baseline.  Hx per family.  Denies pain.    Meds, vitals, and allergies reviewed.   ROS: See HPI.  Otherwise, noncontributory.  nad ncat B cerumen impaction.   Hard of hearing.   Irrigated B and curette used on L ear.   No complications.  Recheck TMs wnl, minimal canal irritation but no overt infection.  Hearing improved.

## 2014-12-05 ENCOUNTER — Telehealth: Payer: Self-pay | Admitting: Family Medicine

## 2014-12-05 NOTE — Telephone Encounter (Signed)
Opened in error

## 2015-01-13 ENCOUNTER — Other Ambulatory Visit: Payer: Self-pay | Admitting: Family Medicine

## 2015-04-14 ENCOUNTER — Telehealth: Payer: Self-pay | Admitting: Family Medicine

## 2015-04-14 NOTE — Telephone Encounter (Signed)
TH scheduled appt Sat clinic 04/15/15 at 10 Am with Dr Sarajane Jews.

## 2015-04-14 NOTE — Telephone Encounter (Signed)
[facility]  Patient Name: Ashley House  DOB: August 06, 1918    Initial Comment Caller states grandmother has a bad cough, not been able to sleep and she has been stumbling around more than normal. has temp of 100.4    Nurse Assessment  Nurse: Arnetha Courser, RN, Susie Date/Time (Eastern Time): 04/14/2015 1:21:56 PM  Confirm and document reason for call. If symptomatic, describe symptoms. You must click the next button to save text entered. ---Caller states grandmother has nonproductive cough and her balance appears to be changed, she is stumbling; has a temperature of 100.4; no respiratory difficulties; appetite decreased;  Has the patient traveled out of the country within the last 30 days? ---Not Applicable  Does the patient have any new or worsening symptoms? ---Yes  Will a triage be completed? ---Yes  Related visit to physician within the last 2 weeks? ---N/A  Does the PT have any chronic conditions? (i.e. diabetes, asthma, etc.) ---Yes  List chronic conditions. ---macular degeneration, dementia,  Is this a behavioral health or substance abuse call? ---No     Guidelines    Guideline Title Affirmed Question Affirmed Notes  Cough - Acute Non-Productive SEVERE coughing spells (e.g., whooping sound after coughing, vomiting after coughing)    Final Disposition User   See Physician within 24 Hours Wisdom, RN, Susie    Referrals  REFERRED TO PCP OFFICE   Disagree/Comply: Comply

## 2015-04-15 ENCOUNTER — Ambulatory Visit (INDEPENDENT_AMBULATORY_CARE_PROVIDER_SITE_OTHER): Payer: Medicare Other | Admitting: Family Medicine

## 2015-04-15 ENCOUNTER — Encounter: Payer: Self-pay | Admitting: Family Medicine

## 2015-04-15 VITALS — BP 118/64 | HR 62 | Temp 98.8°F

## 2015-04-15 DIAGNOSIS — J189 Pneumonia, unspecified organism: Secondary | ICD-10-CM | POA: Diagnosis not present

## 2015-04-15 DIAGNOSIS — R197 Diarrhea, unspecified: Secondary | ICD-10-CM | POA: Diagnosis not present

## 2015-04-15 DIAGNOSIS — F0391 Unspecified dementia with behavioral disturbance: Secondary | ICD-10-CM

## 2015-04-15 DIAGNOSIS — F03918 Unspecified dementia, unspecified severity, with other behavioral disturbance: Secondary | ICD-10-CM

## 2015-04-15 MED ORDER — AMOXICILLIN-POT CLAVULANATE 875-125 MG PO TABS: 1.0000 | ORAL_TABLET | Freq: Two times a day (BID) | ORAL | Status: DC

## 2015-04-15 MED ORDER — DIPHENOXYLATE-ATROPINE 2.5-0.025 MG PO TABS: 1.0000 | ORAL_TABLET | Freq: Four times a day (QID) | ORAL | Status: DC | PRN

## 2015-04-15 NOTE — Progress Notes (Signed)
   Subjective:    Patient ID: Ashley House, female    DOB: [birth year], 82 y.o.   MRN: [id number]  HPI Here with family for 3 days of coughing, and she is producing green sputum. She has had fevers to 100 degrees off and on. She has not seemed to be SOB, but she has been weaker and more off balance than usual. She has dementia, and she seldom complains of anything. Her appetite is preserved and she is drinking fluids. The family also mentions she has had chronic diarrhea for at least a year, and she has seen Dr. Carlean Purl and Dr. Glori Bickers for this. She wears Depends pants but keeping her clean has been very difficult lately. The family asks if there is anything we can do.    Review of Systems  Constitutional: Positive for fever.  Respiratory: Positive for cough. Negative for wheezing.   Cardiovascular: Negative.   Gastrointestinal: Positive for diarrhea. Negative for abdominal pain.  Neurological: Positive for weakness.       Objective:   Physical Exam  Constitutional:  Frail, in a wheelchair. Alert but not oriented to place or date   Neck: No JVD present. No thyromegaly present.  Cardiovascular: Normal rate, regular rhythm and intact distal pulses.   2/6 SM   Pulmonary/Chest: Effort normal. No respiratory distress.  Rales are present in the right posterior base           Assessment & Plan:  Probable pneumonia. Treat with Augmentin. For the diarrhea we will try a short trial of Lomotil. I urged the family to make a follow up appt with Dr. Glori Bickers early next week

## 2015-04-15 NOTE — Progress Notes (Signed)
Pre visit review using our clinic review tool, if applicable. No additional management support is needed unless otherwise documented below in the visit note. 

## 2015-04-17 ENCOUNTER — Other Ambulatory Visit: Payer: Self-pay | Admitting: Family Medicine

## 2015-04-18 ENCOUNTER — Ambulatory Visit (INDEPENDENT_AMBULATORY_CARE_PROVIDER_SITE_OTHER)
Admission: RE | Admit: 2015-04-18 | Discharge: 2015-04-18 | Disposition: A | Discharge status: Home / Self Care | Payer: Medicare Other | Source: Ambulatory Visit | Attending: Family Medicine | Admitting: Family Medicine

## 2015-04-18 ENCOUNTER — Encounter: Payer: Self-pay | Admitting: Family Medicine

## 2015-04-18 ENCOUNTER — Ambulatory Visit (INDEPENDENT_AMBULATORY_CARE_PROVIDER_SITE_OTHER): Payer: Medicare Other | Admitting: Family Medicine

## 2015-04-18 VITALS — BP 126/82 | HR 54 | Temp 98.7°F | Wt 122.0 lb

## 2015-04-18 DIAGNOSIS — K5150 Left sided colitis without complications: Secondary | ICD-10-CM | POA: Diagnosis not present

## 2015-04-18 DIAGNOSIS — E538 Deficiency of other specified B group vitamins: Secondary | ICD-10-CM | POA: Diagnosis not present

## 2015-04-18 DIAGNOSIS — J189 Pneumonia, unspecified organism: Secondary | ICD-10-CM

## 2015-04-18 DIAGNOSIS — R05 Cough: Secondary | ICD-10-CM | POA: Diagnosis not present

## 2015-04-18 NOTE — Assessment & Plan Note (Signed)
Is on oral replacement now  Check level No clinical change

## 2015-04-18 NOTE — Assessment & Plan Note (Addendum)
On augmentin  Has influenza in house - she has no fever or body aches and cough is not severe No sob  cxr now - I still hear rales in R base Cbc with diff  Fluids/rest Recommend mucinex prn for cough  Pend rad review

## 2015-04-18 NOTE — Patient Instructions (Signed)
Chest xray now  Try mucinex DM over the counter as needed for cough  If short of breath or fever please let me know  We will contact you with xray reading when it is done  Call your insurance to see if Asacol is now covered (she used to take 400 mg   3 pills twice daily)  Labs today for B12  Continue lomotil as needed - the lowest amount you need to control symptoms

## 2015-04-18 NOTE — Assessment & Plan Note (Signed)
Now off all meds -chronic diarrhea with fecal incontinence  In the past asacol helped tremendously but her ins did not cover lialda did not help   Will take lomotil prn - since that helps symptoms (caution of sedation) Family will call ins (it changed) to see if asacol may be covered again- willing to try

## 2015-04-18 NOTE — Progress Notes (Signed)
Pre visit review using our clinic review tool, if applicable. No additional management support is needed unless otherwise documented below in the visit note. 

## 2015-04-18 NOTE — Progress Notes (Signed)
Subjective:    Patient ID: Ashley House, female    DOB: Feb 03, 1919, 82 y.o.   MRN: [id number]  HPI Here for dx of pneumonia (clinically) from sat clinic visit 4 d ago  Was lethargic  Rales were heard at  R base tx with augmentin  Per family-not a lot of change  Still has a congested cough -not spitting anything out  Does not think she has had a fever   Has had a flu shot and pneumonia shot   2 family members have the flu - tested positive  Daughter has a sinus infection    Today pulse is 54 and 02 sat 94%  Also - given px for lomotil for chronic diarrhea to try It made her sleepy  Also helped the diarrhea  No constipation  Hx of colitis--in the past asacol worked but ins did not cover - ? If new ins would  Stool is dark  Has incontinence     BP Readings from Last 3 Encounters:  04/18/15 126/82  04/15/15 118/64  08/17/14 120/64    Patient Active Problem List   Diagnosis Date Noted  . Cerumen impaction 07/26/2014  . Skin lesion 04/14/2014  . Dark stools 01/12/2013  . Hypokalemia 09/01/2012  . Frequent falls 08/05/2012  . Skin tear 08/05/2012  . Vitamin D deficiency 04/02/2010  . ANXIETY DISORDER 04/02/2010  . B12 deficiency 11/09/2009  . Senile dementia with behavioral disturbance 10/09/2009  . FREQUENCY, URINARY 12/11/2007  . ANEMIA 05/27/2007  . Left sided ulcerative (chronic) colitis ([place]) 03/06/2007  . MACULAR DEGENERATION 03/05/2007  . ALLERGIC RHINITIS 03/05/2007  . DIVERTICULOSIS, COLON 03/05/2007  . UTI'S, RECURRENT 03/05/2007  . OSTEOARTHRITIS 03/05/2007  . Osteoporosis 03/05/2007  . External hemorrhoids without mention of complication 88/50/2774  . HYPERCHOLESTEROLEMIA, PURE 11/26/2006  . HYPERTENSION, BENIGN ESSENTIAL 11/26/2006   Past Medical History  Diagnosis Date  . Allergy   . Hypertension   . Osteoporosis   . Anemia   . Diverticulosis   . Gastritis   . Edema   . Macular degeneration   . Overactive bladder   . DDD  (degenerative disc disease)   . Dementia, senile   . Full dentures   . Macular degeneration     no center vision   Past Surgical History  Procedure Laterality Date  . Cataract extraction    . Colonoscopy    . Lesion removal Right 05/13/2014    Procedure: EXCISION MALIGNANT LESION RIGHT LEG 4 CM/APPLICATION OF ACCELL;  Surgeon: Irene Limbo, MD;  Location: [place];  Service: Plastics;  Laterality: Right;  . Application of a-cell of extremity Right 05/13/2014    Procedure: APPLICATION OF A-CELL OF EXTREMITY;  Surgeon: Irene Limbo, MD;  Location: [place];  Service: Plastics;  Laterality: Right;   Social History  Substance Use Topics  . Smoking status: Never Smoker   . Smokeless tobacco: Current User    Types: Snuff  . Alcohol Use: No   Family History  Problem Relation Age of Onset  . Kidney disease Mother   . Cancer Brother     colon   Allergies  Allergen Reactions  . Ace Inhibitors   . Atorvastatin   . Ezetimibe-Simvastatin   . Guanfacine Hcl   . Tolterodine Tartrate     REACTION: does not work   Current Outpatient Prescriptions on File Prior to Visit  Medication Sig Dispense Refill  . amLODipine (NORVASC) 5 MG tablet Take 1 tablet by mouth  daily 90 tablet 1  . amoxicillin-clavulanate (AUGMENTIN) 875-125 MG tablet Take 1 tablet by mouth 2 (two) times daily. 20 tablet 0  . Cholecalciferol (VITAMIN D) 2000 UNITS CAPS Take 1 capsule by mouth daily.     . Cyanocobalamin (VITAMIN B 12 PO) Take 1,000 mg by mouth.    . diphenoxylate-atropine (LOMOTIL) 2.5-0.025 MG tablet Take 1 tablet by mouth 4 (four) times daily as needed for diarrhea or loose stools. 30 tablet 0  . pravastatin (PRAVACHOL) 40 MG tablet Take 1 tablet by mouth  daily 90 tablet 1   No current facility-administered medications on file prior to visit.    Review of Systems Review of Systems  Constitutional: Negative for fever, appetite change, fatigue and unexpected  weight change.  Eyes: Negative for pain and visual disturbance.  ENT pos for pnd/ neg for congestion or sinus pain  Respiratory: Negative for wheeze  and shortness of breath.   Cardiovascular: Negative for cp or palpitations    Gastrointestinal: Negative for nausea,  and constipation. pos for chronic diarrhea with dark colored stools (with hx of colitis) Genitourinary: Negative for urgency and frequency.  Skin: Negative for pallor or rash   Neurological: Negative for weakness, light-headedness, numbness and headaches.  Hematological: Negative for adenopathy. Does not bruise/bleed easily.  Psychiatric/Behavioral: Negative for dysphoric mood. The patient is not nervous/anxious.  pos for dementia        Objective:   Physical Exam  Constitutional: She appears well-developed and well-nourished. No distress.  Frail app elderly female with dementia in wheelchair   HENT:  Head: Normocephalic and atraumatic.  Mouth/Throat: Oropharynx is clear and moist.  Eyes: Conjunctivae and EOM are normal. Pupils are equal, round, and reactive to light.  Neck: Normal range of motion. Neck supple. No JVD present. Carotid bruit is not present. No thyromegaly present.  Cardiovascular: Normal rate, regular rhythm, normal heart sounds and intact distal pulses.  Exam reveals no gallop.   Pulmonary/Chest: Effort normal and breath sounds normal. No respiratory distress. She has no wheezes. She has no rales.  No crackles  Harsh bs Congested cough  Rales/rhonchi heard at R base  No wheeze Good air exch  Abdominal: Soft. Bowel sounds are normal. She exhibits no distension, no abdominal bruit and no mass. There is no tenderness. There is no rebound.  Musculoskeletal: She exhibits no edema.  Lymphadenopathy:    She has no cervical adenopathy.  Neurological: She is alert. She has normal reflexes. No cranial nerve deficit.  Skin: Skin is warm and dry. No rash noted.  Psychiatric:  Baseline dementia  Content    Answers a few questions and repeats herself          Assessment & Plan:   Problem List Items Addressed This Visit      Respiratory   CAP (community acquired pneumonia) - Primary    On augmentin  Has influenza in house - she has no fever or body aches and cough is not severe No sob  cxr now - I still hear rales in R base Cbc with diff  Fluids/rest Recommend mucinex prn for cough  Pend rad review       Relevant Orders   DG Chest 2 View (Completed)   CBC with Differential/Platelet     Digestive   B12 deficiency    Is on oral replacement now  Check level No clinical change       Relevant Orders   Vitamin B12   Left sided ulcerative (chronic) colitis (  [place])    Now off all meds -chronic diarrhea with fecal incontinence  In the past asacol helped tremendously but her ins did not cover lialda did not help   Will take lomotil prn - since that helps symptoms (caution of sedation) Family will call ins (it changed) to see if asacol may be covered again- willing to try

## 2015-04-19 ENCOUNTER — Telehealth: Payer: Self-pay | Admitting: Family Medicine

## 2015-04-19 LAB — CBC WITH DIFFERENTIAL/PLATELET
BASOS ABS: 0.1 10*3/uL (ref 0.0–0.1)
Basophils Relative: 1.2 % (ref 0.0–3.0)
EOS ABS: 0 10*3/uL (ref 0.0–0.7)
Eosinophils Relative: 0.3 % (ref 0.0–5.0)
HEMATOCRIT: 35.9 % — AB (ref 36.0–46.0)
Hemoglobin: 12.1 g/dL (ref 12.0–15.0)
LYMPHS ABS: 1.7 10*3/uL (ref 0.7–4.0)
LYMPHS PCT: 35.2 % (ref 12.0–46.0)
MCHC: 33.7 g/dL (ref 30.0–36.0)
MCV: 81.4 fl (ref 78.0–100.0)
MONOS PCT: 10.2 % (ref 3.0–12.0)
Monocytes Absolute: 0.5 10*3/uL (ref 0.1–1.0)
NEUTROS ABS: 2.6 10*3/uL (ref 1.4–7.7)
NEUTROS PCT: 53.1 % (ref 43.0–77.0)
Platelets: 257 10*3/uL (ref 150.0–400.0)
RBC: 4.41 Mil/uL (ref 3.87–5.11)
RDW: 13.6 % (ref 11.5–15.5)
WBC: 4.9 10*3/uL (ref 4.0–10.5)

## 2015-04-19 LAB — VITAMIN B12: Vitamin B-12: >1500 pg/mL — ABNORMAL HIGH (ref 211–911)

## 2015-04-19 MED ORDER — MESALAMINE 400 MG PO TBEC: 1200.0000 mg | DELAYED_RELEASE_TABLET | Freq: Two times a day (BID) | ORAL | Status: DC

## 2015-04-19 NOTE — Telephone Encounter (Signed)
Daughter is aware

## 2015-04-19 NOTE — Telephone Encounter (Signed)
Spoke with Pt's emergency contact Becky May re: mesalamine (ASACOL) 400 MG EC tablet for pt.    Daughter states she spoke with insurance and medication either name brand or generic for 180 tablets is over $600.  Insurance company recommends that prescription be sent in to a local pharmacy so they can possibly get better pricing. [facility].  Best number to call pt's EC is [phone]

## 2015-04-19 NOTE — Telephone Encounter (Signed)
I sent it  They can see how it goes

## 2015-04-24 NOTE — Telephone Encounter (Signed)
Received fax saying Rx isn't covered at local pharmacy either, preferred meds are, sulfasalazine, apriso, and lialda

## 2015-04-24 NOTE — Telephone Encounter (Signed)
Please let family know that it is unfortunately not covered

## 2015-04-25 NOTE — Telephone Encounter (Signed)
Daughter aware.

## 2015-05-03 ENCOUNTER — Telehealth: Payer: Self-pay | Admitting: Family Medicine

## 2015-05-03 NOTE — Telephone Encounter (Signed)
Patient's daughter returned Shapale's call.

## 2015-05-03 NOTE — Telephone Encounter (Signed)
Left voicemail requesting pt's daughter Jacqlyn Larsen May to call office back

## 2015-05-03 NOTE — Telephone Encounter (Signed)
I took over her med from GI when she was stable with the med they no longer cover (her colitis medicine) -there is not much more I can do at this point besides getting her back to GI for a visit to see what they recommend/if any new options   Would they be open to this ?

## 2015-05-03 NOTE — Telephone Encounter (Signed)
Daughter called in - the rx is 600.00 at [facility] - requesting different rx called in.   cb number is [phone], becky may

## 2015-05-03 NOTE — Telephone Encounter (Signed)
Daughter said due to pt's overall health/condition it's hard to get her back to [place] to see a GI doc. Daughter said last time you contacted the GI doc and this med is what they advise, daughter wants to know if you can talk to the GI doctor again an see if they will tell you any alt. meds they recommend because it's almost impossible to get pt to doc appt especially in [facility]

## 2015-05-04 NOTE — Telephone Encounter (Signed)
The best thing they can do to protect skin is use A and D/ Desitin or other barrier cream after they clean her-this will help keep the stool from eroding her skin

## 2015-05-04 NOTE — Telephone Encounter (Signed)
Let them know -I send Dr Carlean Purl a message - will see if he has any suggestions

## 2015-05-04 NOTE — Telephone Encounter (Signed)
Daughter notified of Dr. Marliss Coots comments and verbalized understanding

## 2015-05-04 NOTE — Telephone Encounter (Signed)
Daughter notified of Dr. Marliss Coots comments. Daughter wanted me to let Dr. Glori Bickers know that pt have very bad bowel incontinence and every time she moves she is having accidents and that her stool is loose and dark, daughter said it's not tary looking but it's dark and they are having a hard time keeping pt clean because it's constantly coming out, daughter didn't know what to do for pt, please advise

## 2015-05-04 NOTE — Telephone Encounter (Signed)
Patient's daughter is returning Shapale's call.

## 2015-05-08 ENCOUNTER — Telehealth: Payer: Self-pay | Admitting: Family Medicine

## 2015-05-08 MED ORDER — PREDNISONE 10 MG PO TABS: 10.0000 mg | ORAL_TABLET | Freq: Every day | ORAL | Status: DC

## 2015-05-08 NOTE — Telephone Encounter (Signed)
Please let pt's caregiver know that I got in touch with GI- and he recommended a trial of prednisone (did not have a lot else to recommend besides lomotil-which I think she already has/ they have tried) Prednisone has a lot of side effects (inc in appetite and blood sugar/ and worsening of bone density) so he recommend we try low dose  If they are agreeable please call in prednisone 10 mg with instructions to take 2 pills once daily for 2 weeks and then cut to 1 pill daily   # 60  1 refill  Please let me know how she is doing after 1 week on the 10 mg dose  Can use lomotil as needed  Thanks

## 2015-05-08 NOTE — Telephone Encounter (Signed)
Spoke to patient's daughter. She will start her on the prednisone and she still has some lomotil left. They will use it as needed.

## 2015-10-11 ENCOUNTER — Encounter: Payer: Self-pay | Admitting: Internal Medicine

## 2015-10-11 ENCOUNTER — Ambulatory Visit (INDEPENDENT_AMBULATORY_CARE_PROVIDER_SITE_OTHER)
Admission: RE | Admit: 2015-10-11 | Discharge: 2015-10-11 | Disposition: A | Discharge status: Home / Self Care | Payer: Medicare Other | Source: Ambulatory Visit | Attending: Internal Medicine | Admitting: Internal Medicine

## 2015-10-11 ENCOUNTER — Ambulatory Visit (INDEPENDENT_AMBULATORY_CARE_PROVIDER_SITE_OTHER): Payer: Medicare Other | Admitting: Internal Medicine

## 2015-10-11 VITALS — BP 124/70 | HR 84 | Temp 98.0°F

## 2015-10-11 DIAGNOSIS — Y92099 Unspecified place in other non-institutional residence as the place of occurrence of the external cause: Secondary | ICD-10-CM

## 2015-10-11 DIAGNOSIS — M79602 Pain in left arm: Secondary | ICD-10-CM

## 2015-10-11 DIAGNOSIS — Y92009 Unspecified place in unspecified non-institutional (private) residence as the place of occurrence of the external cause: Secondary | ICD-10-CM

## 2015-10-11 DIAGNOSIS — W19XXXA Unspecified fall, initial encounter: Secondary | ICD-10-CM

## 2015-10-11 DIAGNOSIS — M25512 Pain in left shoulder: Secondary | ICD-10-CM | POA: Diagnosis not present

## 2015-10-11 DIAGNOSIS — S42302A Unspecified fracture of shaft of humerus, left arm, initial encounter for closed fracture: Secondary | ICD-10-CM | POA: Diagnosis not present

## 2015-10-11 DIAGNOSIS — S42212A Unspecified displaced fracture of surgical neck of left humerus, initial encounter for closed fracture: Secondary | ICD-10-CM | POA: Diagnosis not present

## 2015-10-11 NOTE — Addendum Note (Signed)
Addended by: Jearld Fenton on: 10/11/2015 03:27 PM   Modules accepted: Orders

## 2015-10-11 NOTE — Patient Instructions (Signed)
Fall Prevention in the Home  Falls can cause injuries and can affect people from all age groups. There are many simple things that you can do to make your home safe and to help prevent falls. WHAT CAN I DO ON THE OUTSIDE OF MY HOME?  Regularly repair the edges of walkways and driveways and fix any cracks.  Remove high doorway thresholds.  Trim any shrubbery on the main path into your home.  Use bright outdoor lighting.  Clear walkways of debris and clutter, including tools and rocks.  Regularly check that handrails are securely fastened and in good repair. Both sides of any steps should have handrails.  Install guardrails along the edges of any raised decks or porches.  Have leaves, snow, and ice cleared regularly.  Use sand or salt on walkways during winter months.  In the garage, clean up any spills right away, including grease or oil spills. WHAT CAN I DO IN THE BATHROOM?  Use night lights.  Install grab bars by the toilet and in the tub and shower. Do not use towel bars as grab bars.  Use non-skid mats or decals on the floor of the tub or shower.  If you need to sit down while you are in the shower, use a plastic, non-slip stool..  Keep the floor dry. Immediately clean up any water that spills on the floor.  Remove soap buildup in the tub or shower on a regular basis.  Attach bath mats securely with double-sided non-slip rug tape.  Remove throw rugs and other tripping hazards from the floor. WHAT CAN I DO IN THE BEDROOM?  Use night lights.  Make sure that a bedside light is easy to reach.  Do not use oversized bedding that drapes onto the floor.  Have a firm chair that has side arms to use for getting dressed.  Remove throw rugs and other tripping hazards from the floor. WHAT CAN I DO IN THE KITCHEN?   Clean up any spills right away.  Avoid walking on wet floors.  Place frequently used items in easy-to-reach places.  If you need to reach for something  above you, use a sturdy step stool that has a grab bar.  Keep electrical cables out of the way.  Do not use floor polish or wax that makes floors slippery. If you have to use wax, make sure that it is non-skid floor wax.  Remove throw rugs and other tripping hazards from the floor. WHAT CAN I DO IN THE STAIRWAYS?  Do not leave any items on the stairs.  Make sure that there are handrails on both sides of the stairs. Fix handrails that are broken or loose. Make sure that handrails are as long as the stairways.  Check any carpeting to make sure that it is firmly attached to the stairs. Fix any carpet that is loose or worn.  Avoid having throw rugs at the top or bottom of stairways, or secure the rugs with carpet tape to prevent them from moving.  Make sure that you have a light switch at the top of the stairs and the bottom of the stairs. If you do not have them, have them installed. WHAT ARE SOME OTHER FALL PREVENTION TIPS?  Wear closed-toe shoes that fit well and support your feet. Wear shoes that have rubber soles or low heels.  When you use a stepladder, make sure that it is completely opened and that the sides are firmly locked. Have someone hold the ladder while you   are using it. Do not climb a closed stepladder.  Add color or contrast paint or tape to grab bars and handrails in your home. Place contrasting color strips on the first and last steps.  Use mobility aids as needed, such as canes, walkers, scooters, and crutches.  Turn on lights if it is dark. Replace any light bulbs that burn out.  Set up furniture so that there are clear paths. Keep the furniture in the same spot.  Fix any uneven floor surfaces.  Choose a carpet design that does not hide the edge of steps of a stairway.  Be aware of any and all pets.  Review your medicines with your healthcare provider. Some medicines can cause dizziness or changes in blood pressure, which increase your risk of falling. Talk  with your health care provider about other ways that you can decrease your risk of falls. This may include working with a physical therapist or trainer to improve your strength, balance, and endurance.   This information is not intended to replace advice given to you by your health care provider. Make sure you discuss any questions you have with your health care provider.   Document Released: 02/01/2002 Document Revised: 06/28/2014 Document Reviewed: 03/18/2014 Elsevier Interactive Patient Education 2016 Elsevier Inc.  

## 2015-10-11 NOTE — Progress Notes (Signed)
Subjective:    Patient ID: Ashley House, female    DOB: [birth year], 82 y.o.   MRN: [id number]  HPI  Pt presents to the clinic today s/p a fall that occurred in her home last night. Her daughter reports when she arrived at her mothers house, she was laying on her back, in front of the stove, holding her left arm. The mother reports she may have hit her head but she did not lose consciousness. They did have difficulty getting her up out of the floor. They tried to get her mother to go the ER but she refused. Her daughter reports today, her mother has not been moving her left arm very much. She has been taking Tylenol with some relief, but her daughter reports she groaned all night. She does have a history of osteoporosis.  Review of Systems  Past Medical History:  Diagnosis Date  . Allergy   . Anemia   . DDD (degenerative disc disease)   . Dementia, senile   . Diverticulosis   . Edema   . Full dentures   . Gastritis   . Hypertension   . Macular degeneration   . Macular degeneration    no center vision  . Osteoporosis   . Overactive bladder     Current Outpatient Prescriptions  Medication Sig Dispense Refill  . amLODipine (NORVASC) 5 MG tablet Take 1 tablet by mouth  daily 90 tablet 1  . Cholecalciferol (VITAMIN D) 2000 UNITS CAPS Take 1 capsule by mouth daily.     . Cyanocobalamin (VITAMIN B 12 PO) Take 1,000 mg by mouth.    . diphenoxylate-atropine (LOMOTIL) 2.5-0.025 MG tablet Take 1 tablet by mouth 4 (four) times daily as needed for diarrhea or loose stools. 30 tablet 0  . pravastatin (PRAVACHOL) 40 MG tablet Take 1 tablet by mouth  daily 90 tablet 1  . predniSONE (DELTASONE) 10 MG tablet Take 1 tablet (10 mg total) by mouth daily. Take 2 tablets daily for 2 weeks, then 1 tablet daily 60 tablet 1   No current facility-administered medications for this visit.     Allergies  Allergen Reactions  . Ace Inhibitors   . Atorvastatin   . Ezetimibe-Simvastatin   . Guanfacine  Hcl   . Tolterodine Tartrate     REACTION: does not work    Family History  Problem Relation Age of Onset  . Kidney disease Mother   . Cancer Brother     colon    Social History   Social History  . Marital status: Married    Spouse name: N/A  . Number of children: 4  . Years of education: N/A   Occupational History  . Retired Retired   Social History Main Topics  . Smoking status: Never Smoker  . Smokeless tobacco: Current User    Types: Snuff  . Alcohol use No  . Drug use: No  . Sexual activity: Not on file   Other Topics Concern  . Not on file   Social History Narrative  . No narrative on file     Constitutional: Denies fever, malaise, fatigue, headache or abrupt weight changes.  Musculoskeletal: Pt reports left arm pain. Denies difficulty with gait, muscle pain or joint swelling.  Skin: Pt reports swelling of left wrist. Denies redness, rashes, lesions or ulcercations.    No other specific complaints in a complete review of systems (except as listed in HPI above).     Objective:   Physical Exam  BP 124/70   Pulse 84   Temp 98 F (36.7 C) (Tympanic)   SpO2 96%  Wt Readings from Last 3 Encounters:  04/18/15 122 lb (55.3 kg)  08/17/14 116 lb 8 oz (52.8 kg)  07/26/14 118 lb 8 oz (53.8 kg)    General: Appears her stated age, chronically ill appearing in NAD. Skin: Warm, dry and intact. Swelling noted of her left wrist. No bruising or abrasions noted. Musculoskeletal: Unable to assess passive ROM of left shoulder due to pain. She did have pain with palpation of the left shoulder. No deformity of the left clavicle. Normal passive flexion and extension of the left elbow. Normal passive flexion, extension, rotation, pronation and supination of the left wrist. Neurological: Sensation intact to left upper extremity.   BMET    Component Value Date/Time   NA 140 05/11/2014 1040   K 3.8 05/11/2014 1040   CL 105 05/11/2014 1040   CO2 27 05/11/2014 1040     GLUCOSE 61 (L) 05/11/2014 1040   BUN 9 05/11/2014 1040   CREATININE 0.91 05/11/2014 1040   CALCIUM 9.4 05/11/2014 1040   GFRNONAA 52 (L) 05/11/2014 1040   GFRAA 61 (L) 05/11/2014 1040    Lipid Panel     Component Value Date/Time   CHOL 147 08/05/2012 1254   TRIG 98.0 08/05/2012 1254   HDL 43.20 08/05/2012 1254   CHOLHDL 3 08/05/2012 1254   VLDL 19.6 08/05/2012 1254   LDLCALC 84 08/05/2012 1254    CBC    Component Value Date/Time   WBC 4.9 04/18/2015 1545   RBC 4.41 04/18/2015 1545   HGB 12.1 04/18/2015 1545   HCT 35.9 (L) 04/18/2015 1545   PLT 257.0 04/18/2015 1545   MCV 81.4 04/18/2015 1545   MCHC 33.7 04/18/2015 1545   RDW 13.6 04/18/2015 1545   LYMPHSABS 1.7 04/18/2015 1545   MONOABS 0.5 04/18/2015 1545   EOSABS 0.0 04/18/2015 1545   BASOSABS 0.1 04/18/2015 1545    Hgb A1C Lab Results  Component Value Date   HGBA1C (H) 02/13/2007    6.3 (NOTE)   The ADA recommends the following therapeutic goals for glycemic   control related to Hgb A1C measurement:   Goal of Therapy:   < 7.0% Hgb A1C   Action Suggested:  > 8.0% Hgb A1C   Ref:  Diabetes Care, 22, Suppl. 1, 1999           Assessment & Plan:   Left shoulder pain, left arm pain s/p fall:  Will xray left shoulder and left humerus today Arm placed in sling for comfort Continue Tylenol every 4 hours for pain Family declines RX for [employer] may be helpful  Will follow up after xray, RTC as needed Webb Silversmith, NP

## 2015-10-11 NOTE — Progress Notes (Signed)
Pre visit review using our clinic review tool, if applicable. No additional management support is needed unless otherwise documented below in the visit note. 

## 2015-10-14 DIAGNOSIS — K5150 Left sided colitis without complications: Secondary | ICD-10-CM | POA: Diagnosis not present

## 2015-10-14 DIAGNOSIS — Z79891 Long term (current) use of opiate analgesic: Secondary | ICD-10-CM | POA: Diagnosis not present

## 2015-10-14 DIAGNOSIS — M810 Age-related osteoporosis without current pathological fracture: Secondary | ICD-10-CM | POA: Diagnosis not present

## 2015-10-14 DIAGNOSIS — I10 Essential (primary) hypertension: Secondary | ICD-10-CM | POA: Diagnosis not present

## 2015-10-14 DIAGNOSIS — Z9181 History of falling: Secondary | ICD-10-CM | POA: Diagnosis not present

## 2015-10-14 DIAGNOSIS — H3530 Unspecified macular degeneration: Secondary | ICD-10-CM | POA: Diagnosis not present

## 2015-10-14 DIAGNOSIS — W19XXXD Unspecified fall, subsequent encounter: Secondary | ICD-10-CM | POA: Diagnosis not present

## 2015-10-14 DIAGNOSIS — M1991 Primary osteoarthritis, unspecified site: Secondary | ICD-10-CM | POA: Diagnosis not present

## 2015-10-14 DIAGNOSIS — S42212D Unspecified displaced fracture of surgical neck of left humerus, subsequent encounter for fracture with routine healing: Secondary | ICD-10-CM | POA: Diagnosis not present

## 2015-10-16 ENCOUNTER — Telehealth: Payer: Self-pay

## 2015-10-16 DIAGNOSIS — S42295A Other nondisplaced fracture of upper end of left humerus, initial encounter for closed fracture: Secondary | ICD-10-CM | POA: Diagnosis not present

## 2015-10-16 DIAGNOSIS — M25512 Pain in left shoulder: Secondary | ICD-10-CM | POA: Diagnosis not present

## 2015-10-16 NOTE — Telephone Encounter (Signed)
Please ok those verbal orders

## 2015-10-16 NOTE — Telephone Encounter (Signed)
Levada Dy nurse with [facility] left v/m requesting verbal orders for home health nursing 1 x a week for 1 week;2 x a week for 3 weeks and 1 x a week for 3 weeks. Home health aide for 2 weeks. Pt has appt with ortho today;pt has fx.of lt humerus. Angela request cb.

## 2015-10-16 NOTE — Telephone Encounter (Signed)
Left detailed message on voicemail of Levada Dy, nurse with [facility].

## 2015-10-17 DIAGNOSIS — I10 Essential (primary) hypertension: Secondary | ICD-10-CM | POA: Diagnosis not present

## 2015-10-17 DIAGNOSIS — W19XXXD Unspecified fall, subsequent encounter: Secondary | ICD-10-CM | POA: Diagnosis not present

## 2015-10-17 DIAGNOSIS — M1991 Primary osteoarthritis, unspecified site: Secondary | ICD-10-CM | POA: Diagnosis not present

## 2015-10-17 DIAGNOSIS — K5150 Left sided colitis without complications: Secondary | ICD-10-CM | POA: Diagnosis not present

## 2015-10-17 DIAGNOSIS — S42212D Unspecified displaced fracture of surgical neck of left humerus, subsequent encounter for fracture with routine healing: Secondary | ICD-10-CM | POA: Diagnosis not present

## 2015-10-17 DIAGNOSIS — H3530 Unspecified macular degeneration: Secondary | ICD-10-CM | POA: Diagnosis not present

## 2015-10-17 DIAGNOSIS — M810 Age-related osteoporosis without current pathological fracture: Secondary | ICD-10-CM | POA: Diagnosis not present

## 2015-10-17 DIAGNOSIS — Z9181 History of falling: Secondary | ICD-10-CM | POA: Diagnosis not present

## 2015-10-17 DIAGNOSIS — Z79891 Long term (current) use of opiate analgesic: Secondary | ICD-10-CM | POA: Diagnosis not present

## 2015-10-18 DIAGNOSIS — Z9181 History of falling: Secondary | ICD-10-CM | POA: Diagnosis not present

## 2015-10-18 DIAGNOSIS — M1991 Primary osteoarthritis, unspecified site: Secondary | ICD-10-CM | POA: Diagnosis not present

## 2015-10-18 DIAGNOSIS — K5150 Left sided colitis without complications: Secondary | ICD-10-CM | POA: Diagnosis not present

## 2015-10-18 DIAGNOSIS — M810 Age-related osteoporosis without current pathological fracture: Secondary | ICD-10-CM | POA: Diagnosis not present

## 2015-10-18 DIAGNOSIS — I10 Essential (primary) hypertension: Secondary | ICD-10-CM | POA: Diagnosis not present

## 2015-10-18 DIAGNOSIS — W19XXXD Unspecified fall, subsequent encounter: Secondary | ICD-10-CM | POA: Diagnosis not present

## 2015-10-18 DIAGNOSIS — H3530 Unspecified macular degeneration: Secondary | ICD-10-CM | POA: Diagnosis not present

## 2015-10-18 DIAGNOSIS — Z79891 Long term (current) use of opiate analgesic: Secondary | ICD-10-CM | POA: Diagnosis not present

## 2015-10-18 DIAGNOSIS — S42212D Unspecified displaced fracture of surgical neck of left humerus, subsequent encounter for fracture with routine healing: Secondary | ICD-10-CM | POA: Diagnosis not present

## 2015-10-20 DIAGNOSIS — S42212D Unspecified displaced fracture of surgical neck of left humerus, subsequent encounter for fracture with routine healing: Secondary | ICD-10-CM | POA: Diagnosis not present

## 2015-10-20 DIAGNOSIS — K5150 Left sided colitis without complications: Secondary | ICD-10-CM | POA: Diagnosis not present

## 2015-10-20 DIAGNOSIS — H3530 Unspecified macular degeneration: Secondary | ICD-10-CM | POA: Diagnosis not present

## 2015-10-20 DIAGNOSIS — M810 Age-related osteoporosis without current pathological fracture: Secondary | ICD-10-CM | POA: Diagnosis not present

## 2015-10-20 DIAGNOSIS — M1991 Primary osteoarthritis, unspecified site: Secondary | ICD-10-CM | POA: Diagnosis not present

## 2015-10-20 DIAGNOSIS — I10 Essential (primary) hypertension: Secondary | ICD-10-CM | POA: Diagnosis not present

## 2015-10-20 DIAGNOSIS — Z9181 History of falling: Secondary | ICD-10-CM | POA: Diagnosis not present

## 2015-10-20 DIAGNOSIS — Z79891 Long term (current) use of opiate analgesic: Secondary | ICD-10-CM | POA: Diagnosis not present

## 2015-10-20 DIAGNOSIS — W19XXXD Unspecified fall, subsequent encounter: Secondary | ICD-10-CM | POA: Diagnosis not present

## 2015-10-23 DIAGNOSIS — I10 Essential (primary) hypertension: Secondary | ICD-10-CM | POA: Diagnosis not present

## 2015-10-23 DIAGNOSIS — K5150 Left sided colitis without complications: Secondary | ICD-10-CM | POA: Diagnosis not present

## 2015-10-23 DIAGNOSIS — S42212D Unspecified displaced fracture of surgical neck of left humerus, subsequent encounter for fracture with routine healing: Secondary | ICD-10-CM | POA: Diagnosis not present

## 2015-10-23 DIAGNOSIS — W19XXXD Unspecified fall, subsequent encounter: Secondary | ICD-10-CM | POA: Diagnosis not present

## 2015-10-23 DIAGNOSIS — M810 Age-related osteoporosis without current pathological fracture: Secondary | ICD-10-CM | POA: Diagnosis not present

## 2015-10-23 DIAGNOSIS — M1991 Primary osteoarthritis, unspecified site: Secondary | ICD-10-CM | POA: Diagnosis not present

## 2015-10-23 DIAGNOSIS — H3530 Unspecified macular degeneration: Secondary | ICD-10-CM | POA: Diagnosis not present

## 2015-10-23 DIAGNOSIS — Z9181 History of falling: Secondary | ICD-10-CM | POA: Diagnosis not present

## 2015-10-23 DIAGNOSIS — Z79891 Long term (current) use of opiate analgesic: Secondary | ICD-10-CM | POA: Diagnosis not present

## 2015-10-25 DIAGNOSIS — I10 Essential (primary) hypertension: Secondary | ICD-10-CM | POA: Diagnosis not present

## 2015-10-25 DIAGNOSIS — S42212D Unspecified displaced fracture of surgical neck of left humerus, subsequent encounter for fracture with routine healing: Secondary | ICD-10-CM | POA: Diagnosis not present

## 2015-10-25 DIAGNOSIS — M810 Age-related osteoporosis without current pathological fracture: Secondary | ICD-10-CM | POA: Diagnosis not present

## 2015-10-25 DIAGNOSIS — Z79891 Long term (current) use of opiate analgesic: Secondary | ICD-10-CM | POA: Diagnosis not present

## 2015-10-25 DIAGNOSIS — M1991 Primary osteoarthritis, unspecified site: Secondary | ICD-10-CM | POA: Diagnosis not present

## 2015-10-25 DIAGNOSIS — Z9181 History of falling: Secondary | ICD-10-CM | POA: Diagnosis not present

## 2015-10-25 DIAGNOSIS — W19XXXD Unspecified fall, subsequent encounter: Secondary | ICD-10-CM | POA: Diagnosis not present

## 2015-10-25 DIAGNOSIS — H3530 Unspecified macular degeneration: Secondary | ICD-10-CM | POA: Diagnosis not present

## 2015-10-25 DIAGNOSIS — K5150 Left sided colitis without complications: Secondary | ICD-10-CM | POA: Diagnosis not present

## 2015-10-26 DIAGNOSIS — Z79891 Long term (current) use of opiate analgesic: Secondary | ICD-10-CM | POA: Diagnosis not present

## 2015-10-26 DIAGNOSIS — W19XXXD Unspecified fall, subsequent encounter: Secondary | ICD-10-CM | POA: Diagnosis not present

## 2015-10-26 DIAGNOSIS — I10 Essential (primary) hypertension: Secondary | ICD-10-CM | POA: Diagnosis not present

## 2015-10-26 DIAGNOSIS — H3530 Unspecified macular degeneration: Secondary | ICD-10-CM | POA: Diagnosis not present

## 2015-10-26 DIAGNOSIS — M1991 Primary osteoarthritis, unspecified site: Secondary | ICD-10-CM | POA: Diagnosis not present

## 2015-10-26 DIAGNOSIS — S42212D Unspecified displaced fracture of surgical neck of left humerus, subsequent encounter for fracture with routine healing: Secondary | ICD-10-CM | POA: Diagnosis not present

## 2015-10-26 DIAGNOSIS — K5150 Left sided colitis without complications: Secondary | ICD-10-CM | POA: Diagnosis not present

## 2015-10-26 DIAGNOSIS — Z9181 History of falling: Secondary | ICD-10-CM | POA: Diagnosis not present

## 2015-10-26 DIAGNOSIS — M810 Age-related osteoporosis without current pathological fracture: Secondary | ICD-10-CM | POA: Diagnosis not present

## 2015-10-27 DIAGNOSIS — W19XXXD Unspecified fall, subsequent encounter: Secondary | ICD-10-CM | POA: Diagnosis not present

## 2015-10-27 DIAGNOSIS — H3530 Unspecified macular degeneration: Secondary | ICD-10-CM | POA: Diagnosis not present

## 2015-10-27 DIAGNOSIS — I10 Essential (primary) hypertension: Secondary | ICD-10-CM | POA: Diagnosis not present

## 2015-10-27 DIAGNOSIS — M810 Age-related osteoporosis without current pathological fracture: Secondary | ICD-10-CM | POA: Diagnosis not present

## 2015-10-27 DIAGNOSIS — K5150 Left sided colitis without complications: Secondary | ICD-10-CM | POA: Diagnosis not present

## 2015-10-27 DIAGNOSIS — Z9181 History of falling: Secondary | ICD-10-CM | POA: Diagnosis not present

## 2015-10-27 DIAGNOSIS — Z79891 Long term (current) use of opiate analgesic: Secondary | ICD-10-CM | POA: Diagnosis not present

## 2015-10-27 DIAGNOSIS — M1991 Primary osteoarthritis, unspecified site: Secondary | ICD-10-CM | POA: Diagnosis not present

## 2015-10-27 DIAGNOSIS — S42212D Unspecified displaced fracture of surgical neck of left humerus, subsequent encounter for fracture with routine healing: Secondary | ICD-10-CM | POA: Diagnosis not present

## 2015-10-31 DIAGNOSIS — S42212D Unspecified displaced fracture of surgical neck of left humerus, subsequent encounter for fracture with routine healing: Secondary | ICD-10-CM | POA: Diagnosis not present

## 2015-10-31 DIAGNOSIS — Z9181 History of falling: Secondary | ICD-10-CM | POA: Diagnosis not present

## 2015-10-31 DIAGNOSIS — Z79891 Long term (current) use of opiate analgesic: Secondary | ICD-10-CM | POA: Diagnosis not present

## 2015-10-31 DIAGNOSIS — K5150 Left sided colitis without complications: Secondary | ICD-10-CM | POA: Diagnosis not present

## 2015-10-31 DIAGNOSIS — W19XXXD Unspecified fall, subsequent encounter: Secondary | ICD-10-CM | POA: Diagnosis not present

## 2015-10-31 DIAGNOSIS — M810 Age-related osteoporosis without current pathological fracture: Secondary | ICD-10-CM | POA: Diagnosis not present

## 2015-10-31 DIAGNOSIS — H3530 Unspecified macular degeneration: Secondary | ICD-10-CM | POA: Diagnosis not present

## 2015-10-31 DIAGNOSIS — M1991 Primary osteoarthritis, unspecified site: Secondary | ICD-10-CM | POA: Diagnosis not present

## 2015-10-31 DIAGNOSIS — I10 Essential (primary) hypertension: Secondary | ICD-10-CM | POA: Diagnosis not present

## 2015-11-02 DIAGNOSIS — M810 Age-related osteoporosis without current pathological fracture: Secondary | ICD-10-CM | POA: Diagnosis not present

## 2015-11-02 DIAGNOSIS — Z9181 History of falling: Secondary | ICD-10-CM | POA: Diagnosis not present

## 2015-11-02 DIAGNOSIS — M1991 Primary osteoarthritis, unspecified site: Secondary | ICD-10-CM | POA: Diagnosis not present

## 2015-11-02 DIAGNOSIS — Z79891 Long term (current) use of opiate analgesic: Secondary | ICD-10-CM | POA: Diagnosis not present

## 2015-11-02 DIAGNOSIS — H3530 Unspecified macular degeneration: Secondary | ICD-10-CM | POA: Diagnosis not present

## 2015-11-02 DIAGNOSIS — I10 Essential (primary) hypertension: Secondary | ICD-10-CM | POA: Diagnosis not present

## 2015-11-02 DIAGNOSIS — K5150 Left sided colitis without complications: Secondary | ICD-10-CM | POA: Diagnosis not present

## 2015-11-02 DIAGNOSIS — W19XXXD Unspecified fall, subsequent encounter: Secondary | ICD-10-CM | POA: Diagnosis not present

## 2015-11-02 DIAGNOSIS — S42212D Unspecified displaced fracture of surgical neck of left humerus, subsequent encounter for fracture with routine healing: Secondary | ICD-10-CM | POA: Diagnosis not present

## 2015-11-03 DIAGNOSIS — S42212D Unspecified displaced fracture of surgical neck of left humerus, subsequent encounter for fracture with routine healing: Secondary | ICD-10-CM | POA: Diagnosis not present

## 2015-11-03 DIAGNOSIS — K5150 Left sided colitis without complications: Secondary | ICD-10-CM | POA: Diagnosis not present

## 2015-11-03 DIAGNOSIS — Z9181 History of falling: Secondary | ICD-10-CM | POA: Diagnosis not present

## 2015-11-03 DIAGNOSIS — M1991 Primary osteoarthritis, unspecified site: Secondary | ICD-10-CM | POA: Diagnosis not present

## 2015-11-03 DIAGNOSIS — W19XXXD Unspecified fall, subsequent encounter: Secondary | ICD-10-CM | POA: Diagnosis not present

## 2015-11-03 DIAGNOSIS — M810 Age-related osteoporosis without current pathological fracture: Secondary | ICD-10-CM | POA: Diagnosis not present

## 2015-11-03 DIAGNOSIS — H3530 Unspecified macular degeneration: Secondary | ICD-10-CM | POA: Diagnosis not present

## 2015-11-03 DIAGNOSIS — Z79891 Long term (current) use of opiate analgesic: Secondary | ICD-10-CM | POA: Diagnosis not present

## 2015-11-03 DIAGNOSIS — I10 Essential (primary) hypertension: Secondary | ICD-10-CM | POA: Diagnosis not present

## 2015-11-07 DIAGNOSIS — W19XXXD Unspecified fall, subsequent encounter: Secondary | ICD-10-CM | POA: Diagnosis not present

## 2015-11-07 DIAGNOSIS — I10 Essential (primary) hypertension: Secondary | ICD-10-CM | POA: Diagnosis not present

## 2015-11-07 DIAGNOSIS — Z9181 History of falling: Secondary | ICD-10-CM | POA: Diagnosis not present

## 2015-11-07 DIAGNOSIS — M810 Age-related osteoporosis without current pathological fracture: Secondary | ICD-10-CM | POA: Diagnosis not present

## 2015-11-07 DIAGNOSIS — Z79891 Long term (current) use of opiate analgesic: Secondary | ICD-10-CM | POA: Diagnosis not present

## 2015-11-07 DIAGNOSIS — S42212D Unspecified displaced fracture of surgical neck of left humerus, subsequent encounter for fracture with routine healing: Secondary | ICD-10-CM | POA: Diagnosis not present

## 2015-11-07 DIAGNOSIS — H3530 Unspecified macular degeneration: Secondary | ICD-10-CM | POA: Diagnosis not present

## 2015-11-07 DIAGNOSIS — M1991 Primary osteoarthritis, unspecified site: Secondary | ICD-10-CM | POA: Diagnosis not present

## 2015-11-07 DIAGNOSIS — K5150 Left sided colitis without complications: Secondary | ICD-10-CM | POA: Diagnosis not present

## 2015-11-08 DIAGNOSIS — M1991 Primary osteoarthritis, unspecified site: Secondary | ICD-10-CM | POA: Diagnosis not present

## 2015-11-08 DIAGNOSIS — H3530 Unspecified macular degeneration: Secondary | ICD-10-CM | POA: Diagnosis not present

## 2015-11-08 DIAGNOSIS — W19XXXD Unspecified fall, subsequent encounter: Secondary | ICD-10-CM | POA: Diagnosis not present

## 2015-11-08 DIAGNOSIS — I10 Essential (primary) hypertension: Secondary | ICD-10-CM | POA: Diagnosis not present

## 2015-11-08 DIAGNOSIS — Z9181 History of falling: Secondary | ICD-10-CM | POA: Diagnosis not present

## 2015-11-08 DIAGNOSIS — K5150 Left sided colitis without complications: Secondary | ICD-10-CM | POA: Diagnosis not present

## 2015-11-08 DIAGNOSIS — M810 Age-related osteoporosis without current pathological fracture: Secondary | ICD-10-CM | POA: Diagnosis not present

## 2015-11-08 DIAGNOSIS — S42212D Unspecified displaced fracture of surgical neck of left humerus, subsequent encounter for fracture with routine healing: Secondary | ICD-10-CM | POA: Diagnosis not present

## 2015-11-08 DIAGNOSIS — Z79891 Long term (current) use of opiate analgesic: Secondary | ICD-10-CM | POA: Diagnosis not present

## 2015-11-09 DIAGNOSIS — W19XXXD Unspecified fall, subsequent encounter: Secondary | ICD-10-CM | POA: Diagnosis not present

## 2015-11-09 DIAGNOSIS — Z79891 Long term (current) use of opiate analgesic: Secondary | ICD-10-CM | POA: Diagnosis not present

## 2015-11-09 DIAGNOSIS — I10 Essential (primary) hypertension: Secondary | ICD-10-CM | POA: Diagnosis not present

## 2015-11-09 DIAGNOSIS — Z9181 History of falling: Secondary | ICD-10-CM | POA: Diagnosis not present

## 2015-11-09 DIAGNOSIS — K5150 Left sided colitis without complications: Secondary | ICD-10-CM | POA: Diagnosis not present

## 2015-11-09 DIAGNOSIS — M1991 Primary osteoarthritis, unspecified site: Secondary | ICD-10-CM | POA: Diagnosis not present

## 2015-11-09 DIAGNOSIS — M810 Age-related osteoporosis without current pathological fracture: Secondary | ICD-10-CM | POA: Diagnosis not present

## 2015-11-09 DIAGNOSIS — S42212D Unspecified displaced fracture of surgical neck of left humerus, subsequent encounter for fracture with routine healing: Secondary | ICD-10-CM | POA: Diagnosis not present

## 2015-11-09 DIAGNOSIS — H3530 Unspecified macular degeneration: Secondary | ICD-10-CM | POA: Diagnosis not present

## 2015-11-10 DIAGNOSIS — I10 Essential (primary) hypertension: Secondary | ICD-10-CM | POA: Diagnosis not present

## 2015-11-10 DIAGNOSIS — S42212D Unspecified displaced fracture of surgical neck of left humerus, subsequent encounter for fracture with routine healing: Secondary | ICD-10-CM | POA: Diagnosis not present

## 2015-11-10 DIAGNOSIS — Z79891 Long term (current) use of opiate analgesic: Secondary | ICD-10-CM | POA: Diagnosis not present

## 2015-11-10 DIAGNOSIS — K5150 Left sided colitis without complications: Secondary | ICD-10-CM | POA: Diagnosis not present

## 2015-11-10 DIAGNOSIS — M810 Age-related osteoporosis without current pathological fracture: Secondary | ICD-10-CM | POA: Diagnosis not present

## 2015-11-10 DIAGNOSIS — Z9181 History of falling: Secondary | ICD-10-CM | POA: Diagnosis not present

## 2015-11-10 DIAGNOSIS — H3530 Unspecified macular degeneration: Secondary | ICD-10-CM | POA: Diagnosis not present

## 2015-11-10 DIAGNOSIS — W19XXXD Unspecified fall, subsequent encounter: Secondary | ICD-10-CM | POA: Diagnosis not present

## 2015-11-10 DIAGNOSIS — M1991 Primary osteoarthritis, unspecified site: Secondary | ICD-10-CM | POA: Diagnosis not present

## 2015-11-13 DIAGNOSIS — S42295D Other nondisplaced fracture of upper end of left humerus, subsequent encounter for fracture with routine healing: Secondary | ICD-10-CM | POA: Diagnosis not present

## 2015-11-14 DIAGNOSIS — M1991 Primary osteoarthritis, unspecified site: Secondary | ICD-10-CM | POA: Diagnosis not present

## 2015-11-14 DIAGNOSIS — M810 Age-related osteoporosis without current pathological fracture: Secondary | ICD-10-CM | POA: Diagnosis not present

## 2015-11-14 DIAGNOSIS — K5150 Left sided colitis without complications: Secondary | ICD-10-CM | POA: Diagnosis not present

## 2015-11-14 DIAGNOSIS — H3530 Unspecified macular degeneration: Secondary | ICD-10-CM | POA: Diagnosis not present

## 2015-11-14 DIAGNOSIS — Z79891 Long term (current) use of opiate analgesic: Secondary | ICD-10-CM | POA: Diagnosis not present

## 2015-11-14 DIAGNOSIS — S42212D Unspecified displaced fracture of surgical neck of left humerus, subsequent encounter for fracture with routine healing: Secondary | ICD-10-CM | POA: Diagnosis not present

## 2015-11-14 DIAGNOSIS — I10 Essential (primary) hypertension: Secondary | ICD-10-CM | POA: Diagnosis not present

## 2015-11-14 DIAGNOSIS — W19XXXD Unspecified fall, subsequent encounter: Secondary | ICD-10-CM | POA: Diagnosis not present

## 2015-11-14 DIAGNOSIS — Z9181 History of falling: Secondary | ICD-10-CM | POA: Diagnosis not present

## 2015-11-15 DIAGNOSIS — S42212D Unspecified displaced fracture of surgical neck of left humerus, subsequent encounter for fracture with routine healing: Secondary | ICD-10-CM | POA: Diagnosis not present

## 2015-11-15 DIAGNOSIS — M810 Age-related osteoporosis without current pathological fracture: Secondary | ICD-10-CM | POA: Diagnosis not present

## 2015-11-15 DIAGNOSIS — K5150 Left sided colitis without complications: Secondary | ICD-10-CM | POA: Diagnosis not present

## 2015-11-15 DIAGNOSIS — I10 Essential (primary) hypertension: Secondary | ICD-10-CM | POA: Diagnosis not present

## 2015-11-15 DIAGNOSIS — Z9181 History of falling: Secondary | ICD-10-CM | POA: Diagnosis not present

## 2015-11-15 DIAGNOSIS — H3530 Unspecified macular degeneration: Secondary | ICD-10-CM | POA: Diagnosis not present

## 2015-11-15 DIAGNOSIS — Z79891 Long term (current) use of opiate analgesic: Secondary | ICD-10-CM | POA: Diagnosis not present

## 2015-11-15 DIAGNOSIS — W19XXXD Unspecified fall, subsequent encounter: Secondary | ICD-10-CM | POA: Diagnosis not present

## 2015-11-15 DIAGNOSIS — M1991 Primary osteoarthritis, unspecified site: Secondary | ICD-10-CM | POA: Diagnosis not present

## 2015-11-16 DIAGNOSIS — Z79891 Long term (current) use of opiate analgesic: Secondary | ICD-10-CM | POA: Diagnosis not present

## 2015-11-16 DIAGNOSIS — H3530 Unspecified macular degeneration: Secondary | ICD-10-CM | POA: Diagnosis not present

## 2015-11-16 DIAGNOSIS — I10 Essential (primary) hypertension: Secondary | ICD-10-CM | POA: Diagnosis not present

## 2015-11-16 DIAGNOSIS — S42212D Unspecified displaced fracture of surgical neck of left humerus, subsequent encounter for fracture with routine healing: Secondary | ICD-10-CM | POA: Diagnosis not present

## 2015-11-16 DIAGNOSIS — W19XXXD Unspecified fall, subsequent encounter: Secondary | ICD-10-CM | POA: Diagnosis not present

## 2015-11-16 DIAGNOSIS — K5150 Left sided colitis without complications: Secondary | ICD-10-CM | POA: Diagnosis not present

## 2015-11-16 DIAGNOSIS — M810 Age-related osteoporosis without current pathological fracture: Secondary | ICD-10-CM | POA: Diagnosis not present

## 2015-11-16 DIAGNOSIS — Z9181 History of falling: Secondary | ICD-10-CM | POA: Diagnosis not present

## 2015-11-16 DIAGNOSIS — M1991 Primary osteoarthritis, unspecified site: Secondary | ICD-10-CM | POA: Diagnosis not present

## 2015-11-17 DIAGNOSIS — H3530 Unspecified macular degeneration: Secondary | ICD-10-CM | POA: Diagnosis not present

## 2015-11-17 DIAGNOSIS — W19XXXD Unspecified fall, subsequent encounter: Secondary | ICD-10-CM | POA: Diagnosis not present

## 2015-11-17 DIAGNOSIS — Z9181 History of falling: Secondary | ICD-10-CM | POA: Diagnosis not present

## 2015-11-17 DIAGNOSIS — M1991 Primary osteoarthritis, unspecified site: Secondary | ICD-10-CM | POA: Diagnosis not present

## 2015-11-17 DIAGNOSIS — S42212D Unspecified displaced fracture of surgical neck of left humerus, subsequent encounter for fracture with routine healing: Secondary | ICD-10-CM | POA: Diagnosis not present

## 2015-11-17 DIAGNOSIS — Z79891 Long term (current) use of opiate analgesic: Secondary | ICD-10-CM | POA: Diagnosis not present

## 2015-11-17 DIAGNOSIS — K5150 Left sided colitis without complications: Secondary | ICD-10-CM | POA: Diagnosis not present

## 2015-11-17 DIAGNOSIS — M810 Age-related osteoporosis without current pathological fracture: Secondary | ICD-10-CM | POA: Diagnosis not present

## 2015-11-17 DIAGNOSIS — I10 Essential (primary) hypertension: Secondary | ICD-10-CM | POA: Diagnosis not present

## 2015-11-20 ENCOUNTER — Ambulatory Visit: Payer: Medicare Other | Admitting: Primary Care

## 2015-11-20 ENCOUNTER — Telehealth: Payer: Self-pay | Admitting: Family Medicine

## 2015-11-20 ENCOUNTER — Encounter: Payer: Self-pay | Admitting: Primary Care

## 2015-11-20 ENCOUNTER — Ambulatory Visit (INDEPENDENT_AMBULATORY_CARE_PROVIDER_SITE_OTHER): Payer: Medicare Other | Admitting: Primary Care

## 2015-11-20 ENCOUNTER — Ambulatory Visit (INDEPENDENT_AMBULATORY_CARE_PROVIDER_SITE_OTHER)
Admission: RE | Admit: 2015-11-20 | Discharge: 2015-11-20 | Disposition: A | Discharge status: Home / Self Care | Payer: Medicare Other | Source: Ambulatory Visit | Attending: Primary Care | Admitting: Primary Care

## 2015-11-20 VITALS — BP 126/80 | HR 70 | Temp 98.0°F | Wt 123.0 lb

## 2015-11-20 DIAGNOSIS — M79604 Pain in right leg: Secondary | ICD-10-CM

## 2015-11-20 DIAGNOSIS — S79911A Unspecified injury of right hip, initial encounter: Secondary | ICD-10-CM | POA: Diagnosis not present

## 2015-11-20 DIAGNOSIS — R319 Hematuria, unspecified: Secondary | ICD-10-CM

## 2015-11-20 DIAGNOSIS — Z23 Encounter for immunization: Secondary | ICD-10-CM

## 2015-11-20 DIAGNOSIS — N390 Urinary tract infection, site not specified: Secondary | ICD-10-CM

## 2015-11-20 DIAGNOSIS — M25551 Pain in right hip: Secondary | ICD-10-CM | POA: Diagnosis not present

## 2015-11-20 LAB — POC URINALSYSI DIPSTICK (AUTOMATED)
Glucose, UA: NEGATIVE
NITRITE UA: POSITIVE
PH UA: 5.5
Spec Grav, UA: >=1.03
Urobilinogen, UA: 1

## 2015-11-20 MED ORDER — SULFAMETHOXAZOLE-TRIMETHOPRIM 800-160 MG PO TABS: 1.0000 | ORAL_TABLET | Freq: Two times a day (BID) | ORAL | 0 refills | Status: DC

## 2015-11-20 NOTE — Telephone Encounter (Signed)
Noted  

## 2015-11-20 NOTE — Telephone Encounter (Signed)
Ashley House called to cancel appointment they are taking her to the er.

## 2015-11-20 NOTE — Progress Notes (Signed)
Subjective:    Patient ID: Ashley House, female    DOB: Jul 29, 1918, 82 y.o.   MRN: [id number]  HPI  Ms. Sedlacek is a 82 year old female with a history of senile dementia, osteoporosis, anemia, frequent falls, and vitamin D deficiency who presents today with a chief complaint of lower extremity pain. Her pain is located to the right lower extremity since a fall that occurred Saturday night while getting ready for bed. She was walking towards her bed, grabbed a hold of her husbands walker, she then let go of the walker when her legs "gave way". She leaned and fell onto the left hip. When they attempted to get her in the bed she could not lift her right lower extremity. They then attempted to put her on the couch but she flinched each time the moved her right lower extremity. She is ambulatory with a right sided limp and has not had difficulty sitting on the toilet. She has no pain when sitting and resting.  She has a history of left communited humeral fracture 6 weeks ago. Her daughter has noticed nocturia for the past 6-7 weeks. Denies foul smelling urine, hematuria, fevers.  Review of Systems  Constitutional: Negative for chills, fatigue and fever.  Genitourinary: Positive for frequency. Negative for difficulty urinating, dysuria and hematuria.  Musculoskeletal:       Right lower extremity pain       Past Medical History:  Diagnosis Date  . Allergy   . Anemia   . DDD (degenerative disc disease)   . Dementia, senile   . Diverticulosis   . Edema   . Full dentures   . Gastritis   . Hypertension   . Macular degeneration   . Macular degeneration    no center vision  . Osteoporosis   . Overactive bladder      Social History   Social History  . Marital status: Married    Spouse name: N/A  . Number of children: 4  . Years of education: N/A   Occupational History  . Retired Retired   Social History Main Topics  . Smoking status: Never Smoker  . Smokeless tobacco: Current  User    Types: Snuff  . Alcohol use No  . Drug use: No  . Sexual activity: Not on file   Other Topics Concern  . Not on file   Social History Narrative  . No narrative on file    Past Surgical History:  Procedure Laterality Date  . APPLICATION OF A-CELL OF EXTREMITY Right 05/13/2014   Procedure: APPLICATION OF A-CELL OF EXTREMITY;  Surgeon: Irene Limbo, MD;  Location: [facility];  Service: Plastics;  Laterality: Right;  . CATARACT EXTRACTION    . COLONOSCOPY    . LESION REMOVAL Right 05/13/2014   Procedure: EXCISION MALIGNANT LESION RIGHT LEG 4 CM/APPLICATION OF ACCELL;  Surgeon: Irene Limbo, MD;  Location: [facility];  Service: Plastics;  Laterality: Right;    Family History  Problem Relation Age of Onset  . Kidney disease Mother   . Cancer Brother     colon    Allergies  Allergen Reactions  . Ace Inhibitors   . Atorvastatin   . Ezetimibe-Simvastatin   . Guanfacine Hcl   . Tolterodine Tartrate     REACTION: does not work    Current Outpatient Prescriptions on File Prior to Visit  Medication Sig Dispense Refill  . amLODipine (NORVASC) 5 MG tablet Take 1 tablet by mouth  daily 90 tablet 1  . Cholecalciferol (VITAMIN D) 2000 UNITS CAPS Take 1 capsule by mouth daily.     . Cyanocobalamin (VITAMIN B 12 PO) Take 1,000 mg by mouth.    . diphenoxylate-atropine (LOMOTIL) 2.5-0.025 MG tablet Take 1 tablet by mouth 4 (four) times daily as needed for diarrhea or loose stools. 30 tablet 0  . pravastatin (PRAVACHOL) 40 MG tablet Take 1 tablet by mouth  daily 90 tablet 1   No current facility-administered medications on file prior to visit.     BP 126/80 (BP Location: Right Arm, Patient Position: Sitting, Cuff Size: Normal)   Pulse 70   Temp 98 F (36.7 C) (Oral)   Wt 123 lb (55.8 kg)   SpO2 94%   BMI 22.50 kg/m    Objective:   Physical Exam  Constitutional: She appears well-nourished.  Cardiovascular: Normal rate and regular  rhythm.   Pulmonary/Chest: Effort normal and breath sounds normal.  Musculoskeletal:       Right hip: She exhibits decreased range of motion. She exhibits no tenderness, no bony tenderness and no swelling.       Left hip: She exhibits normal range of motion, no tenderness and no swelling.  Ambulatory in clinic with limp to right lower extremity. No obvious shortening. No right femur, tib/fib, knee pain.  Skin: Skin is warm and dry.          Assessment & Plan:  Right Lower Extremity Pain:  Located to right lower extremity since fall 2 days ago. Overall sounds like low impact from fall. Exam with flinching and discomfort with ROM to hip of right lower extremity. No bruising. Left lower extremity unremarkable. Will check UA today given nocturia. UA: 3+ leuks, positive nitrites, 2+ blood. Culture sent. Rx for bactrim ds tablets sent to pharmacy. Xray of right pelvis without evidence of fracture..  Elevation, rest, ice, tramadol as needed for pain.  Sheral Flow, NP

## 2015-11-20 NOTE — Telephone Encounter (Signed)
Thanks-looks like her appt was with Anda Kraft -make sure it is cancelled  I will keep watch for ED records

## 2015-11-20 NOTE — Patient Instructions (Addendum)
Complete xray(s) prior to leaving today.   Your urine shows evidence of bacterial infection. Start Bactrim DS (sulfamethoxazole/trimethoprim) tablets for urinary tract infection. Take 1 tablet by mouth twice daily for 5 days.  Ensure you are staying hydrated with water.  Elevate your right leg if possible to reduce swelling to your feet.  You may take tramadol as needed for pain.  I will be in touch with you once I receive the xray results.  It was a pleasure meeting you!   Urinary Tract Infection Urinary tract infections (UTIs) can develop anywhere along your urinary tract. Your urinary tract is your body's drainage system for removing wastes and extra water. Your urinary tract includes two kidneys, two ureters, a bladder, and a urethra. Your kidneys are a pair of bean-shaped organs. Each kidney is about the size of your fist. They are located below your ribs, one on each side of your spine. CAUSES Infections are caused by microbes, which are microscopic organisms, including fungi, viruses, and bacteria. These organisms are so small that they can only be seen through a microscope. Bacteria are the microbes that most commonly cause UTIs. SYMPTOMS  Symptoms of UTIs may vary by age and gender of the patient and by the location of the infection. Symptoms in young women typically include a frequent and intense urge to urinate and a painful, burning feeling in the bladder or urethra during urination. Older women and men are more likely to be tired, shaky, and weak and have muscle aches and abdominal pain. A fever may mean the infection is in your kidneys. Other symptoms of a kidney infection include pain in your back or sides below the ribs, nausea, and vomiting. DIAGNOSIS To diagnose a UTI, your caregiver will ask you about your symptoms. Your caregiver will also ask you to provide a urine sample. The urine sample will be tested for bacteria and white blood cells. White blood cells are made by your  body to help fight infection. TREATMENT  Typically, UTIs can be treated with medication. Because most UTIs are caused by a bacterial infection, they usually can be treated with the use of antibiotics. The choice of antibiotic and length of treatment depend on your symptoms and the type of bacteria causing your infection. HOME CARE INSTRUCTIONS  If you were prescribed antibiotics, take them exactly as your caregiver instructs you. Finish the medication even if you feel better after you have only taken some of the medication.  Drink enough water and fluids to keep your urine clear or pale yellow.  Avoid caffeine, tea, and carbonated beverages. They tend to irritate your bladder.  Empty your bladder often. Avoid holding urine for long periods of time.  Empty your bladder before and after sexual intercourse.  After a bowel movement, women should cleanse from front to back. Use each tissue only once. SEEK MEDICAL CARE IF:   You have back pain.  You develop a fever.  Your symptoms do not begin to resolve within 3 days. SEEK IMMEDIATE MEDICAL CARE IF:   You have severe back pain or lower abdominal pain.  You develop chills.  You have nausea or vomiting.  You have continued burning or discomfort with urination. MAKE SURE YOU:   Understand these instructions.  Will watch your condition.  Will get help right away if you are not doing well or get worse.   This information is not intended to replace advice given to you by your health care provider. Make sure you discuss any  questions you have with your health care provider.   Document Released: 11/21/2004 Document Revised: 11/02/2014 Document Reviewed: 03/22/2011 Elsevier Interactive Patient Education [employer].

## 2015-11-20 NOTE — Progress Notes (Signed)
Pre visit review using our clinic review tool, if applicable. No additional management support is needed unless otherwise documented below in the visit note. 

## 2015-11-21 DIAGNOSIS — W19XXXD Unspecified fall, subsequent encounter: Secondary | ICD-10-CM | POA: Diagnosis not present

## 2015-11-21 DIAGNOSIS — I10 Essential (primary) hypertension: Secondary | ICD-10-CM | POA: Diagnosis not present

## 2015-11-21 DIAGNOSIS — K5150 Left sided colitis without complications: Secondary | ICD-10-CM | POA: Diagnosis not present

## 2015-11-21 DIAGNOSIS — S42212D Unspecified displaced fracture of surgical neck of left humerus, subsequent encounter for fracture with routine healing: Secondary | ICD-10-CM | POA: Diagnosis not present

## 2015-11-21 DIAGNOSIS — Z79891 Long term (current) use of opiate analgesic: Secondary | ICD-10-CM | POA: Diagnosis not present

## 2015-11-21 DIAGNOSIS — M1991 Primary osteoarthritis, unspecified site: Secondary | ICD-10-CM | POA: Diagnosis not present

## 2015-11-21 DIAGNOSIS — H3530 Unspecified macular degeneration: Secondary | ICD-10-CM | POA: Diagnosis not present

## 2015-11-21 DIAGNOSIS — Z9181 History of falling: Secondary | ICD-10-CM | POA: Diagnosis not present

## 2015-11-21 DIAGNOSIS — M810 Age-related osteoporosis without current pathological fracture: Secondary | ICD-10-CM | POA: Diagnosis not present

## 2015-11-23 DIAGNOSIS — Z9181 History of falling: Secondary | ICD-10-CM | POA: Diagnosis not present

## 2015-11-23 DIAGNOSIS — H3530 Unspecified macular degeneration: Secondary | ICD-10-CM | POA: Diagnosis not present

## 2015-11-23 DIAGNOSIS — M810 Age-related osteoporosis without current pathological fracture: Secondary | ICD-10-CM | POA: Diagnosis not present

## 2015-11-23 DIAGNOSIS — K5150 Left sided colitis without complications: Secondary | ICD-10-CM | POA: Diagnosis not present

## 2015-11-23 DIAGNOSIS — Z79891 Long term (current) use of opiate analgesic: Secondary | ICD-10-CM | POA: Diagnosis not present

## 2015-11-23 DIAGNOSIS — M1991 Primary osteoarthritis, unspecified site: Secondary | ICD-10-CM | POA: Diagnosis not present

## 2015-11-23 DIAGNOSIS — I10 Essential (primary) hypertension: Secondary | ICD-10-CM | POA: Diagnosis not present

## 2015-11-23 DIAGNOSIS — W19XXXD Unspecified fall, subsequent encounter: Secondary | ICD-10-CM | POA: Diagnosis not present

## 2015-11-23 DIAGNOSIS — S42212D Unspecified displaced fracture of surgical neck of left humerus, subsequent encounter for fracture with routine healing: Secondary | ICD-10-CM | POA: Diagnosis not present

## 2015-11-23 LAB — URINE CULTURE

## 2015-11-24 DIAGNOSIS — M1991 Primary osteoarthritis, unspecified site: Secondary | ICD-10-CM | POA: Diagnosis not present

## 2015-11-24 DIAGNOSIS — W19XXXD Unspecified fall, subsequent encounter: Secondary | ICD-10-CM | POA: Diagnosis not present

## 2015-11-24 DIAGNOSIS — Z9181 History of falling: Secondary | ICD-10-CM | POA: Diagnosis not present

## 2015-11-24 DIAGNOSIS — K5150 Left sided colitis without complications: Secondary | ICD-10-CM | POA: Diagnosis not present

## 2015-11-24 DIAGNOSIS — Z79891 Long term (current) use of opiate analgesic: Secondary | ICD-10-CM | POA: Diagnosis not present

## 2015-11-24 DIAGNOSIS — S42212D Unspecified displaced fracture of surgical neck of left humerus, subsequent encounter for fracture with routine healing: Secondary | ICD-10-CM | POA: Diagnosis not present

## 2015-11-24 DIAGNOSIS — I10 Essential (primary) hypertension: Secondary | ICD-10-CM | POA: Diagnosis not present

## 2015-11-24 DIAGNOSIS — H3530 Unspecified macular degeneration: Secondary | ICD-10-CM | POA: Diagnosis not present

## 2015-11-24 DIAGNOSIS — M810 Age-related osteoporosis without current pathological fracture: Secondary | ICD-10-CM | POA: Diagnosis not present

## 2015-11-29 DIAGNOSIS — Z79891 Long term (current) use of opiate analgesic: Secondary | ICD-10-CM | POA: Diagnosis not present

## 2015-11-29 DIAGNOSIS — H3530 Unspecified macular degeneration: Secondary | ICD-10-CM | POA: Diagnosis not present

## 2015-11-29 DIAGNOSIS — S42212D Unspecified displaced fracture of surgical neck of left humerus, subsequent encounter for fracture with routine healing: Secondary | ICD-10-CM | POA: Diagnosis not present

## 2015-11-29 DIAGNOSIS — M810 Age-related osteoporosis without current pathological fracture: Secondary | ICD-10-CM | POA: Diagnosis not present

## 2015-11-29 DIAGNOSIS — Z9181 History of falling: Secondary | ICD-10-CM | POA: Diagnosis not present

## 2015-11-29 DIAGNOSIS — K5150 Left sided colitis without complications: Secondary | ICD-10-CM | POA: Diagnosis not present

## 2015-11-29 DIAGNOSIS — I10 Essential (primary) hypertension: Secondary | ICD-10-CM | POA: Diagnosis not present

## 2015-11-29 DIAGNOSIS — W19XXXD Unspecified fall, subsequent encounter: Secondary | ICD-10-CM | POA: Diagnosis not present

## 2015-11-29 DIAGNOSIS — M1991 Primary osteoarthritis, unspecified site: Secondary | ICD-10-CM | POA: Diagnosis not present

## 2015-12-01 DIAGNOSIS — M1991 Primary osteoarthritis, unspecified site: Secondary | ICD-10-CM | POA: Diagnosis not present

## 2015-12-01 DIAGNOSIS — I10 Essential (primary) hypertension: Secondary | ICD-10-CM | POA: Diagnosis not present

## 2015-12-01 DIAGNOSIS — Z79891 Long term (current) use of opiate analgesic: Secondary | ICD-10-CM | POA: Diagnosis not present

## 2015-12-01 DIAGNOSIS — H3530 Unspecified macular degeneration: Secondary | ICD-10-CM | POA: Diagnosis not present

## 2015-12-01 DIAGNOSIS — K5150 Left sided colitis without complications: Secondary | ICD-10-CM | POA: Diagnosis not present

## 2015-12-01 DIAGNOSIS — Z9181 History of falling: Secondary | ICD-10-CM | POA: Diagnosis not present

## 2015-12-01 DIAGNOSIS — M810 Age-related osteoporosis without current pathological fracture: Secondary | ICD-10-CM | POA: Diagnosis not present

## 2015-12-01 DIAGNOSIS — S42212D Unspecified displaced fracture of surgical neck of left humerus, subsequent encounter for fracture with routine healing: Secondary | ICD-10-CM | POA: Diagnosis not present

## 2015-12-01 DIAGNOSIS — W19XXXD Unspecified fall, subsequent encounter: Secondary | ICD-10-CM | POA: Diagnosis not present

## 2016-01-15 ENCOUNTER — Telehealth: Payer: Self-pay | Admitting: Family Medicine

## 2016-01-15 DIAGNOSIS — R319 Hematuria, unspecified: Principal | ICD-10-CM

## 2016-01-15 DIAGNOSIS — N390 Urinary tract infection, site not specified: Secondary | ICD-10-CM

## 2016-01-15 NOTE — Telephone Encounter (Signed)
Daughter does have someone with her 24/7 so it wont be a problem, daughter said the problem with pt is that she will take her meds in the morning but if they try to give her any oral meds during the day or at night she will refuse and say she is suppose to only take her meds in the morning and no matter what her family tries to tell her she will refuse to take them. Daughter said they used the ativan cream and used "a good amount of it" but it didn't have any affect on her at all (didn't work), please advise

## 2016-01-15 NOTE — Telephone Encounter (Signed)
Left voicemail requesting pt's daughter to call the office back

## 2016-01-15 NOTE — Telephone Encounter (Signed)
Get [place] a sample when you can-thanks  For sleep and agitation-this is tricky because any calming/sleep medications will also increase fall risk at night -will need to have someone there (which it sounds like you do)   I think we tried to px restoril in the past- but she would not take oral med, correct (and then tried a cream with ativan)-did that help?   Would she take a pill or oral medication at this time?

## 2016-01-15 NOTE — Telephone Encounter (Signed)
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  Daughter said pt's sleep habits are "all over the place", some night pt will be up and down every 30 min. to an hr, some nights she could be up and down ever hour and some times it could be every 15 mins., but then pt can sleep 4-5 hrs at a time during the day. Daughter said before pt fell and broke her arm she was wandering outside, she would go to her brother's house next door in the middle of the night. But since breaking her arm she hasn't done that but she does tell her children that she is going to wander off and they wont see her again. Family has started to say with her at night but it's a constant battle to get her back in the bed all night and that's why they are requesting med to help with it because they are afraid if someone isn't there with her she will fall again and hurt herself

## 2016-01-15 NOTE — Telephone Encounter (Signed)
There is a medication called risperidone that is sometimes used for sleep/confusion in pts with later stage dementia.  It does carry some risk (falls/sedation), and also possibly early death - but if it works can be quite helpful and we will sometimes try it when other options are not present  It is available in liquid-so they could mix it in a beverage  (we would start with a low dose in the evening)  Ask the family google this (look it up)   and see what they think or I can print out info on it from our resources to give them  Please let me know what they think

## 2016-01-15 NOTE — Telephone Encounter (Signed)
Patient Name: JOLIE STROHECKER DOB: 12/24/1918 Initial Comment Caller states pt has been in the bathroom all morning, freq urination, asking if she can get a rx. Daughter says it is hard to get her in the office Nurse Assessment Nurse: Ronnald Ramp, RN, Miranda Date/Time Eilene Ghazi Time): 01/15/2016 10:16:24 AM Confirm and document reason for call. If symptomatic, describe symptoms. You must click the next button to save text entered. ---Caller states her mother has had urinary frequency the last few days. Her urine has an odor and the urine is not clear. Has the patient traveled out of the country within the last 30 days? ---Not Applicable Does the patient have any new or worsening symptoms? ---Yes Will a triage be completed? ---Yes Related visit to physician within the last 2 weeks? ---No Does the PT have any chronic conditions? (i.e. diabetes, asthma, etc.) ---Yes List chronic conditions. ---Dementia, HTN, High Cholesterol Is this a behavioral health or substance abuse call? ---No Guidelines Guideline Title Affirmed Question Affirmed Notes Urinary Symptoms Urinating more frequently than usual (i.e., frequency) Final Disposition User See Physician within 24 Hours Jones, Therapist, sports, Jeannetta Nap Comments Due to pt's mental status, caller is wanting to see if the MD will just call in antibiotics without having to make an appt. Told caller that a message would be sent to the MD and if she has not heard back from the MD by 1pm to call back. She is also wanting to know if there is any medication that the pt can be put on to help with Alzheimers or help her sleep. Referrals REFERRED TO PCP OFFICE

## 2016-01-15 NOTE — Telephone Encounter (Signed)
I understand it is very difficult to get her in.  Any chance they could drop off a specimen so we can do ua and culture?  If she has fever/n/v or severe symptoms it is important that she is seen  What is her usual sleep pattern?  Bed time/ time to fall asleep/number of hours of sleep or awakening etc Is she wandering at night?  Safety concerns?

## 2016-01-16 ENCOUNTER — Other Ambulatory Visit (INDEPENDENT_AMBULATORY_CARE_PROVIDER_SITE_OTHER): Payer: Medicare Other

## 2016-01-16 DIAGNOSIS — R8290 Unspecified abnormal findings in urine: Secondary | ICD-10-CM | POA: Diagnosis not present

## 2016-01-16 DIAGNOSIS — R350 Frequency of micturition: Secondary | ICD-10-CM | POA: Diagnosis not present

## 2016-01-16 LAB — POC URINALSYSI DIPSTICK (AUTOMATED)
BILIRUBIN UA: NEGATIVE
Glucose, UA: NEGATIVE
KETONES UA: NEGATIVE
Nitrite, UA: POSITIVE
Spec Grav, UA: 1.02
Urobilinogen, UA: 0.2
pH, UA: 7

## 2016-01-16 MED ORDER — RISPERIDONE 1 MG/ML PO SOLN: 0.5000 mg | Freq: Every day | ORAL | 1 refills | Status: DC

## 2016-01-16 MED ORDER — SULFAMETHOXAZOLE-TRIMETHOPRIM 800-160 MG PO TABS: 1.0000 | ORAL_TABLET | Freq: Two times a day (BID) | ORAL | 0 refills | Status: DC

## 2016-01-16 NOTE — Telephone Encounter (Signed)
Daughter notified of medication Dr. Glori Bickers recommended and wrote it down she will look into it and let us know if they want to try the medication

## 2016-01-16 NOTE — Telephone Encounter (Signed)
I sent the risperidone to the pharmacy- watch her closely with this and keep me posted   ua was pos- culture result is pending I sent bactrim to the pharmacy to begin (she was on it previously)-and we will change if necessary when I get the cx result  Do enc fluids

## 2016-01-16 NOTE — Telephone Encounter (Signed)
Family dropped off urine sample (see result notes), daughter said she looked into the medication and they do want to try the risperidone, pt uses the [facility]/ [address]

## 2016-01-16 NOTE — Telephone Encounter (Signed)
Daughter advised.  Daughter asks that I relay this message:  Ms Pae seems to have a little BM each time she sits down on the toilet.  It doesn't drop into the toilet, there seems to always be a small amount when she wipes herself and she always wipes herself from back to front.  Daughter says they have tried to explain to her to do it differently but it's no use.  Therefore, daughter says she is probably going to continue to have these UTI's.

## 2016-01-16 NOTE — Addendum Note (Signed)
Addended by: Loura Pardon A on: 01/16/2016 05:32 PM   Modules accepted: Orders

## 2016-01-17 NOTE — Telephone Encounter (Signed)
Thanks for letting us know - unless she lets family help wipe her at this point we are stuck  Let's see if this culture comes back with the same bacteria

## 2016-01-18 LAB — URINE CULTURE: Colony Count: >=100000

## 2016-02-04 ENCOUNTER — Other Ambulatory Visit: Payer: Self-pay | Admitting: Family Medicine

## 2016-02-05 NOTE — Telephone Encounter (Signed)
Pt has had a few recent acute appts but no recent f/u or CPE, please advise

## 2016-02-05 NOTE — Telephone Encounter (Signed)
Please schedule late winter f/u 30 mi and refill until then Thanks

## 2016-02-07 ENCOUNTER — Telehealth: Payer: Self-pay | Admitting: Family Medicine

## 2016-02-07 NOTE — Telephone Encounter (Signed)
Patient Name: Ashley House DOB: [birth year] Initial Comment Caller states, her mother has a cough with congested. No fever. No other signs of a cold. High bp rx. for high blood pressure and cough rx. No openings today. Verified Nurse Assessment Nurse: Vallery Sa, RN, Cathy Date/Time (Eastern Time): 02/07/2016 9:31:06 AM Confirm and document reason for call. If symptomatic, describe symptoms. ---Caller states her mother developed a non-productive cough about a week ago. No severe breathing difficulty. No chest pain. No fever. Alert and responsive. Does the patient have any new or worsening symptoms? ---Yes Will a triage be completed? ---Yes Related visit to physician within the last 2 weeks? ---No Does the PT have any chronic conditions? (i.e. diabetes, asthma, etc.) ---Yes List chronic conditions. ---Alzheimer's, high Blood Pressure and Cholesterol, Low Vitamin B levels, Osteoporosis Is this a behavioral health or substance abuse call? ---No Guidelines Guideline Title Affirmed Question Affirmed Notes Cough - Acute Non-Productive SEVERE coughing spells (e.g., whooping sound after coughing, vomiting after coughing) Final Disposition User See Physician within [place], RN, Tye [place] Comments Scheduled for 9:30am appointment with Clarene Reamer 02/08/2016. Referrals REFERRED TO PCP OFFICE Disagree/Comply: Comply

## 2016-02-07 NOTE — Telephone Encounter (Signed)
Pt has appt on 02/08/16 a 9:30 with D Gessner,FNP.

## 2016-02-08 ENCOUNTER — Encounter: Payer: Self-pay | Admitting: Family Medicine

## 2016-02-08 ENCOUNTER — Ambulatory Visit (INDEPENDENT_AMBULATORY_CARE_PROVIDER_SITE_OTHER): Payer: Medicare Other | Admitting: Family Medicine

## 2016-02-08 VITALS — BP 118/62 | HR 73 | Temp 98.2°F | Wt 127.0 lb

## 2016-02-08 DIAGNOSIS — R059 Cough, unspecified: Secondary | ICD-10-CM

## 2016-02-08 DIAGNOSIS — R05 Cough: Secondary | ICD-10-CM

## 2016-02-08 MED ORDER — BENZONATATE 100 MG PO CAPS: 100.0000 mg | ORAL_CAPSULE | Freq: Three times a day (TID) | ORAL | 0 refills | Status: DC | PRN

## 2016-02-08 NOTE — Patient Instructions (Signed)
I have sent in a prescription for cough tablets to the pharmacy  Can also try Delsym if cough tablets don't work Encourage good fluid intake Monitor for fever, shortness of breath   Cough, Adult Coughing is a reflex that clears your throat and your airways. Coughing helps to heal and protect your lungs. It is normal to cough occasionally, but a cough that happens with other symptoms or lasts a long time may be a sign of a condition that needs treatment. A cough may last only 2-3 weeks (acute), or it may last longer than 8 weeks (chronic). What are the causes? Coughing is commonly caused by:  Breathing in substances that irritate your lungs.  A viral or bacterial respiratory infection.  Allergies.  Asthma.  Postnasal drip.  Smoking.  Acid backing up from the stomach into the esophagus (gastroesophageal reflux).  Certain medicines.  Chronic lung problems, including COPD (or rarely, lung cancer).  Other medical conditions such as heart failure. Follow these instructions at home: Pay attention to any changes in your symptoms. Take these actions to help with your discomfort:  Take medicines only as told by your health care provider.  If you were prescribed an antibiotic medicine, take it as told by your health care provider. Do not stop taking the antibiotic even if you start to feel better.  Talk with your health care provider before you take a cough suppressant medicine.  Drink enough fluid to keep your urine clear or pale yellow.  If the air is dry, use a cold steam vaporizer or humidifier in your bedroom or your home to help loosen secretions.  Avoid anything that causes you to cough at work or at home.  If your cough is worse at night, try sleeping in a semi-upright position.  Avoid cigarette smoke. If you smoke, quit smoking. If you need help quitting, ask your health care provider.  Avoid caffeine.  Avoid alcohol.  Rest as needed. Contact a health care provider  if:  You have new symptoms.  You cough up pus.  Your cough does not get better after 2-3 weeks, or your cough gets worse.  You cannot control your cough with suppressant medicines and you are losing sleep.  You develop pain that is getting worse or pain that is not controlled with pain medicines.  You have a fever.  You have unexplained weight loss.  You have night sweats. Get help right away if:  You cough up blood.  You have difficulty breathing.  Your heartbeat is very fast. This information is not intended to replace advice given to you by your health care provider. Make sure you discuss any questions you have with your health care provider. Document Released: 08/10/2010 Document Revised: 07/20/2015 Document Reviewed: 04/20/2014 Elsevier Interactive Patient Education  2017 [employer].

## 2016-02-08 NOTE — Progress Notes (Signed)
Pre visit review using our clinic review tool, if applicable. No additional management support is needed unless otherwise documented below in the visit note. 

## 2016-02-08 NOTE — Progress Notes (Signed)
   Subjective:    Patient ID: Ashley House, female    DOB: April 01, 1918, 82 y.o.   MRN: [id number]  HPI This is a 82 yo female, accompanied by her daughter, who presents today with a dry cough for over a week. No ear pain, no sore throat, no SOB, ? Occasional wheeze. Appetite normal. Has been taking Chlorocedin OTC with short term relief. Cough worse at night. Clear nasal drainage.  Patient lives at home with her husband. Grown children alternate spending nights with them.   Past Medical History:  Diagnosis Date  . Allergy   . Anemia   . DDD (degenerative disc disease)   . Dementia, senile   . Diverticulosis   . Edema   . Full dentures   . Gastritis   . Hypertension   . Macular degeneration   . Macular degeneration    no center vision  . Osteoporosis   . Overactive bladder    Past Surgical History:  Procedure Laterality Date  . APPLICATION OF A-CELL OF EXTREMITY Right 05/13/2014   Procedure: APPLICATION OF A-CELL OF EXTREMITY;  Surgeon: Irene Limbo, MD;  Location: [facility];  Service: Plastics;  Laterality: Right;  . CATARACT EXTRACTION    . COLONOSCOPY    . LESION REMOVAL Right 05/13/2014   Procedure: EXCISION MALIGNANT LESION RIGHT LEG 4 CM/APPLICATION OF ACCELL;  Surgeon: Irene Limbo, MD;  Location: [facility];  Service: Plastics;  Laterality: Right;   Family History  Problem Relation Age of Onset  . Kidney disease Mother   . Cancer Brother     colon   Social History  Substance Use Topics  . Smoking status: Never Smoker  . Smokeless tobacco: Current User    Types: Snuff  . Alcohol use No    Review of Systems Per HPI    Objective:   Physical Exam  Constitutional:  Thin, frail, hard of hearing.   HENT:  Head: Normocephalic and atraumatic.  Right Ear: External ear normal.  Left Ear: External ear normal.  Mouth/Throat: Oropharynx is clear and moist.  Clear rhinorrhea.   Eyes: Conjunctivae are normal.  Cardiovascular:  Normal rate, regular rhythm and normal heart sounds.   Pulmonary/Chest: Effort normal.  Few scattered coarse breath sounds, no wheeze, no cough observed.   Musculoskeletal: She exhibits no edema.  Lymphadenopathy:    She has no cervical adenopathy.  Neurological: She is alert.  Not sure who her daughter is.   Skin: Skin is warm and dry.  Psychiatric:  Pleasant, answers questions when asked.   Vitals reviewed.     BP 118/62 (BP Location: Left Arm, Patient Position: Sitting, Cuff Size: Normal)   Pulse 73   Temp 98.2 F (36.8 C) (Oral)   Wt 127 lb (57.6 kg)   SpO2 93%   BMI 23.23 kg/m  Wt Readings from Last 3 Encounters:  02/08/16 127 lb (57.6 kg)  11/20/15 123 lb (55.8 kg)  04/18/15 122 lb (55.3 kg)       Assessment & Plan:  1. Cough - does not seem bacterial in nature - encouraged good fluid intake, can take Mucinex - benzonatate (TESSALON) 100 MG capsule; Take 1 capsule (100 mg total) by mouth 3 (three) times daily as needed for cough.  Dispense: 40 capsule; Refill: 0 - call/RTC if worsening symptoms or if no improvement with above measures  Clarene Reamer, FNP-BC  [facility], [place]  02/08/2016 10:03 AM

## 2016-03-12 ENCOUNTER — Ambulatory Visit: Payer: Medicare Other | Admitting: Family Medicine

## 2016-05-21 ENCOUNTER — Encounter: Payer: Self-pay | Admitting: Family Medicine

## 2016-05-21 ENCOUNTER — Ambulatory Visit (INDEPENDENT_AMBULATORY_CARE_PROVIDER_SITE_OTHER): Payer: Medicare Other | Admitting: Family Medicine

## 2016-05-21 VITALS — BP 110/70 | HR 77 | Temp 98.2°F | Wt 125.5 lb

## 2016-05-21 DIAGNOSIS — R296 Repeated falls: Secondary | ICD-10-CM | POA: Diagnosis not present

## 2016-05-21 DIAGNOSIS — E559 Vitamin D deficiency, unspecified: Secondary | ICD-10-CM

## 2016-05-21 DIAGNOSIS — Z0000 Encounter for general adult medical examination without abnormal findings: Secondary | ICD-10-CM | POA: Insufficient documentation

## 2016-05-21 DIAGNOSIS — E7800 Pure hypercholesterolemia, unspecified: Secondary | ICD-10-CM

## 2016-05-21 DIAGNOSIS — F0391 Unspecified dementia with behavioral disturbance: Secondary | ICD-10-CM

## 2016-05-21 DIAGNOSIS — H3530 Unspecified macular degeneration: Secondary | ICD-10-CM | POA: Diagnosis not present

## 2016-05-21 DIAGNOSIS — E538 Deficiency of other specified B group vitamins: Secondary | ICD-10-CM | POA: Diagnosis not present

## 2016-05-21 DIAGNOSIS — F03918 Unspecified dementia, unspecified severity, with other behavioral disturbance: Secondary | ICD-10-CM

## 2016-05-21 DIAGNOSIS — M810 Age-related osteoporosis without current pathological fracture: Secondary | ICD-10-CM | POA: Diagnosis not present

## 2016-05-21 DIAGNOSIS — K5150 Left sided colitis without complications: Secondary | ICD-10-CM

## 2016-05-21 DIAGNOSIS — I10 Essential (primary) hypertension: Secondary | ICD-10-CM | POA: Diagnosis not present

## 2016-05-21 LAB — LIPID PANEL
CHOL/HDL RATIO: 4
Cholesterol: 188 mg/dL (ref 0–200)
HDL: 52.5 mg/dL (ref >39.00)
LDL CALC: 110 mg/dL — AB (ref 0–99)
NONHDL: 135.98
TRIGLYCERIDES: 128 mg/dL (ref 0.0–149.0)
VLDL: 25.6 mg/dL (ref 0.0–40.0)

## 2016-05-21 LAB — COMPREHENSIVE METABOLIC PANEL
ALT: 7 U/L (ref 0–35)
AST: 13 U/L (ref 0–37)
Albumin: 4.3 g/dL (ref 3.5–5.2)
Alkaline Phosphatase: 99 U/L (ref 39–117)
BILIRUBIN TOTAL: 0.6 mg/dL (ref 0.2–1.2)
BUN: 14 mg/dL (ref 6–23)
CHLORIDE: 102 meq/L (ref 96–112)
CO2: 26 meq/L (ref 19–32)
Calcium: 10 mg/dL (ref 8.4–10.5)
Creatinine, Ser: 0.84 mg/dL (ref 0.40–1.20)
GFR: 66.81 mL/min (ref >60.00)
GLUCOSE: 93 mg/dL (ref 70–99)
POTASSIUM: 4.3 meq/L (ref 3.5–5.1)
Sodium: 138 mEq/L (ref 135–145)
Total Protein: 7.9 g/dL (ref 6.0–8.3)

## 2016-05-21 LAB — CBC WITH DIFFERENTIAL/PLATELET
BASOS ABS: 0.1 10*3/uL (ref 0.0–0.1)
BASOS PCT: 0.8 % (ref 0.0–3.0)
Eosinophils Absolute: 0.1 10*3/uL (ref 0.0–0.7)
Eosinophils Relative: 1 % (ref 0.0–5.0)
HEMATOCRIT: 42.2 % (ref 36.0–46.0)
Hemoglobin: 13.9 g/dL (ref 12.0–15.0)
LYMPHS ABS: 1.9 10*3/uL (ref 0.7–4.0)
Lymphocytes Relative: 20.9 % (ref 12.0–46.0)
MCHC: 32.9 g/dL (ref 30.0–36.0)
MCV: 83.9 fl (ref 78.0–100.0)
MONOS PCT: 6.9 % (ref 3.0–12.0)
Monocytes Absolute: 0.6 10*3/uL (ref 0.1–1.0)
NEUTROS ABS: 6.4 10*3/uL (ref 1.4–7.7)
NEUTROS PCT: 70.4 % (ref 43.0–77.0)
PLATELETS: 333 10*3/uL (ref 150.0–400.0)
RBC: 5.02 Mil/uL (ref 3.87–5.11)
RDW: 13.5 % (ref 11.5–15.5)
WBC: 9.1 10*3/uL (ref 4.0–10.5)

## 2016-05-21 LAB — VITAMIN B12: VITAMIN B 12: 1245 pg/mL — AB (ref 211–911)

## 2016-05-21 LAB — TSH: TSH: 0.83 u[IU]/mL (ref 0.35–4.50)

## 2016-05-21 LAB — VITAMIN D 25 HYDROXY (VIT D DEFICIENCY, FRACTURES): VITD: 47.08 ng/mL (ref 30.00–100.00)

## 2016-05-21 MED ORDER — PRAVASTATIN SODIUM 40 MG PO TABS: 40.0000 mg | ORAL_TABLET | Freq: Every day | ORAL | 3 refills | Status: DC

## 2016-05-21 MED ORDER — TEMAZEPAM 15 MG PO CAPS: 15.0000 mg | ORAL_CAPSULE | Freq: Every evening | ORAL | 0 refills | Status: DC | PRN

## 2016-05-21 MED ORDER — AMLODIPINE BESYLATE 5 MG PO TABS: 5.0000 mg | ORAL_TABLET | Freq: Every day | ORAL | 3 refills | Status: DC

## 2016-05-21 NOTE — Patient Instructions (Addendum)
Calcium is good for bones and may help slow down stools  Consider it -up to 1200 mg daily with vitamin D  If you do not have a living will - I recommend adding that to your power of attorney   [facility]  Continue to get as much help as you can for her care  Encourage walker or cane when able   Try the temazepam at night - using caution of falls

## 2016-05-21 NOTE — Progress Notes (Signed)
Pre visit review using our clinic review tool, if applicable. No additional management support is needed unless otherwise documented below in the visit note. 

## 2016-05-21 NOTE — Progress Notes (Signed)
Subjective:    Patient ID: Ashley House, female    DOB: [birth year], 83 y.o.   MRN: [id number]  HPI I have personally reviewed the Medicare Annual Wellness questionnaire and have noted 1. The patient's medical and social history 2. Their use of alcohol, tobacco or illicit drugs 3. Their current medications and supplements 4. The patient's functional ability including ADL's, fall risks, home safety risks and hearing or visual             impairment. 5. Diet and physical activities 6. Evidence for depression or mood disorders  The patients weight, height, BMI have been recorded in the chart and visual acuity is per eye clinic.  I have made referrals, counseling and provided education to the patient based review of the above and I have provided the pt with a written personalized care plan for preventive services. Reviewed and updated provider list, see scanned forms.  Macular degeneration- unable to do vision screen Dementia-unable to answer questions for hearing screen   See scanned forms.  Routine anticipatory guidance given to patient.  See health maintenance. Colon cancer screening- past age to do  Breast cancer screening-past age to do   Flu vaccine 9/17 Tetanus vaccine 4/13 Pneumovax-completed Zoster vaccine-not interested  Hx of OP- declines dexa, has arm fracture , taking calcium and B12  High risk of falls / refuses to use walker , has a knot on her head  Advance directive= has a POA ,  Cognitive function addressed- see scanned forms- and if abnormal then additional documentation follows. - she has dementia baseline   PMH and SH reviewed  She c/o of pain all the time - and forgets about it a few minutes later   Cannot control her bowels well- she is incontinent and tends to wipe back to the front  (it is a mess)  Has hx of UC -not on anything right now  Sometimes will allow someone to help her  More difficult to get her to bathe - fights her family  Still at home    Her husband still refuses to put her in a home - family stays every night  A lot of caregiver stress for family  Dementia is worse  She will usually take her medicines  Will not take dementia   No longer goes to the eye doctor- macular degeneration has become too bad to help further    Problems sleeping  Needs refill of temazepam  Has alarm that lets them know when she gets up   Also notes that tramadol helps her sleep and calms her also (has to open the capsule and mix with something)    Meds, vitals, and allergies reviewed.   ROS: See HPI.  Otherwise negative.     Wt Readings from Last 3 Encounters:  05/21/16 125 lb 8 oz (56.9 kg)  02/08/16 127 lb (57.6 kg)  11/20/15 123 lb (55.8 kg)  appetite is good- not a problem to get her to eat  Loves sweets  They let her have them  bmi 22.9  bp is stable today  No cp or palpitations or headaches or edema  No side effects to medicines  BP Readings from Last 3 Encounters:  05/21/16 110/70  02/08/16 118/62  11/20/15 126/80     Hx of vit D def Due for labs   Hx of hyperlipidemia Lab Results  Component Value Date   CHOL 147 08/05/2012   HDL 43.20 08/05/2012   LDLCALC 84 08/05/2012  LDLDIRECT 113.9 04/02/2010   TRIG 98.0 08/05/2012   CHOLHDL 3 08/05/2012   Due for labs Pravastatin and diet   Hx of B12 def Lab Results  Component Value Date   VITAMINB12 >1500 (H) 04/18/2015   Due for labs   Patient Active Problem List   Diagnosis Date Noted  . Routine general medical examination at a health care facility 05/21/2016  . Medicare annual wellness visit, subsequent 05/21/2016  . Hypokalemia 09/01/2012  . Frequent falls 08/05/2012  . Vitamin D deficiency 04/02/2010  . ANXIETY DISORDER 04/02/2010  . B12 deficiency 11/09/2009  . Senile dementia with behavioral disturbance 10/09/2009  . FREQUENCY, URINARY 12/11/2007  . ANEMIA 05/27/2007  . Left sided ulcerative (chronic) colitis ([place]) 03/06/2007  . Macular  degeneration (senile) of retina 03/05/2007  . ALLERGIC RHINITIS 03/05/2007  . DIVERTICULOSIS, COLON 03/05/2007  . UTI'S, RECURRENT 03/05/2007  . OSTEOARTHRITIS 03/05/2007  . Osteoporosis 03/05/2007  . External hemorrhoids without mention of complication 08/67/6195  . HYPERCHOLESTEROLEMIA, PURE 11/26/2006  . HYPERTENSION, BENIGN ESSENTIAL 11/26/2006   Past Medical History:  Diagnosis Date  . Allergy   . Anemia   . DDD (degenerative disc disease)   . Dementia, senile   . Diverticulosis   . Edema   . Full dentures   . Gastritis   . Hypertension   . Macular degeneration   . Macular degeneration    no center vision  . Osteoporosis   . Overactive bladder    Past Surgical History:  Procedure Laterality Date  . APPLICATION OF A-CELL OF EXTREMITY Right 05/13/2014   Procedure: APPLICATION OF A-CELL OF EXTREMITY;  Surgeon: Irene Limbo, MD;  Location: [facility];  Service: Plastics;  Laterality: Right;  . CATARACT EXTRACTION    . COLONOSCOPY    . LESION REMOVAL Right 05/13/2014   Procedure: EXCISION MALIGNANT LESION RIGHT LEG 4 CM/APPLICATION OF ACCELL;  Surgeon: Irene Limbo, MD;  Location: [facility];  Service: Plastics;  Laterality: Right;   Social History  Substance Use Topics  . Smoking status: Never Smoker  . Smokeless tobacco: Current User    Types: Snuff  . Alcohol use No   Family History  Problem Relation Age of Onset  . Kidney disease Mother   . Cancer Brother     colon   Allergies  Allergen Reactions  . Ace Inhibitors   . Atorvastatin   . Ezetimibe-Simvastatin   . Guanfacine Hcl   . Tolterodine Tartrate     REACTION: does not work   Current Outpatient Prescriptions on File Prior to Visit  Medication Sig Dispense Refill  . Cholecalciferol (VITAMIN D) 2000 UNITS CAPS Take 1 capsule by mouth daily.     . Cyanocobalamin (VITAMIN B 12 PO) Take 1,000 mg by mouth.     No current facility-administered medications on file prior  to visit.     Review of Systems Review of Systems  Constitutional: Negative for fever, appetite change, fatigue and unexpected weight change.  Eyes: Negative for pain and pos for blindness .  Respiratory: Negative for cough and shortness of breath.   Cardiovascular: Negative for cp or palpitations    Gastrointestinal: Negative for nausea, diarrhea and constipation.  Genitourinary: Negative for urgency and frequency.  Skin: Negative for pallor or rash   Neurological: Negative for weakness, light-headedness, numbness and headaches.  Hematological: Negative for adenopathy. Does not bruise/bleed easily.  Psychiatric/Behavioral: Negative for dysphoric mood. The patient is not nervous/anxious.  pos for mod to severe dementia with  occ agitation        Objective:   Physical Exam  Constitutional: She appears well-developed and well-nourished. No distress.  Frail appearing elderly female with dementia   HENT:  Head: Normocephalic and atraumatic.  Right Ear: External ear normal.  Left Ear: External ear normal.  Nose: Nose normal.  Mouth/Throat: Oropharynx is clear and moist.  Eyes: Conjunctivae and EOM are normal. Pupils are equal, round, and reactive to light. Right eye exhibits no discharge. Left eye exhibits no discharge. No scleral icterus.  Neck: Normal range of motion. Neck supple. No JVD present. Carotid bruit is not present. No thyromegaly present.  Cardiovascular: Normal rate, regular rhythm, normal heart sounds and intact distal pulses.  Exam reveals no gallop.   Pulmonary/Chest: Effort normal and breath sounds normal. No respiratory distress. She has no wheezes. She has no rales.  Abdominal: Soft. Bowel sounds are normal. She exhibits no distension and no mass. There is no tenderness.  Musculoskeletal: She exhibits no edema or tenderness.  Lymphadenopathy:    She has no cervical adenopathy.  Neurological: She is alert. She has normal reflexes. No cranial nerve deficit. She exhibits  normal muscle tone. Coordination normal.  Mod to severe dementia   Skin: Skin is warm and dry. No rash noted. No erythema. No pallor.  Thin skin with diffuse ecchymosis  No tears   Many sks and aks Few keratotic lesions on face  Lentigines diffusely with solar aging   Psychiatric: She has a normal mood and affect. Cognition and memory are impaired. She exhibits abnormal recent memory.  Repeats questions  Pleasantly confused and not agitated          Assessment & Plan:   Problem List Items Addressed This Visit      Cardiovascular and Mediastinum   HYPERTENSION, BENIGN ESSENTIAL - Primary    bp in fair control at this time  BP Readings from Last 1 Encounters:  05/21/16 110/70   No changes needed Disc lifstyle change with low sodium diet and exercise  Labs today      Relevant Medications   amLODipine (NORVASC) 5 MG tablet   pravastatin (PRAVACHOL) 40 MG tablet     Digestive   Left sided ulcerative (chronic) colitis ([place])    Some problems with fecal incontinence- may have more to do with dementia  No pain or blood in stool No treatment currently        Nervous and Auditory   Senile dementia with behavioral disturbance    Worsening with time  Family has problems getting her to take medications and bathe She has 24 hour care Husband will not let her be placed  She declines dementia med  For agitation- using temazapem at night  If needed- could change to tramadol  / which also helps this and pain        Relevant Medications   temazepam (RESTORIL) 15 MG capsule     Musculoskeletal and Integument   Osteoporosis    Declines dexa due to dementia Declines tx  On ca and adding more D  Fall risk / one fracture  24 hour care-she needs constant supervision to prev falls        Other   B12 deficiency    B12 in labs today  Oral supplementation      Relevant Orders   Vitamin B12 (Completed)   Frequent falls    24 hour care Dementia and blind  Disc fall  prev in detail  HYPERCHOLESTEROLEMIA, PURE    Lab today on pravastatin       Relevant Medications   amLODipine (NORVASC) 5 MG tablet   pravastatin (PRAVACHOL) 40 MG tablet   Macular degeneration (senile) of retina    Legally blind  Has stopped going to eye specialist       Medicare annual wellness visit, subsequent    Reviewed health habits including diet and exercise and skin cancer prevention Reviewed appropriate screening tests for age  Also reviewed health mt list, fam hx and immunization status , as well as social and family history   See HPI Labs drawn today  Will work on living will Declines most screening due to age and dementia  Declines tx for dementia  Declines zoster vaccine        Routine general medical examination at a health care facility    Reviewed health habits including diet and exercise and skin cancer prevention Reviewed appropriate screening tests for age  Also reviewed health mt list, fam hx and immunization status , as well as social and family history   See HPI  AMW done  Labs drawn today  Declines most health screening due to age and dementia Fall risk addressed Will work on living will         Relevant Orders   CBC with Differential/Platelet (Completed)   Comprehensive metabolic panel (Completed)   Lipid panel (Completed)   TSH (Completed)   Vitamin D deficiency    Level today  Disc imp of D to bones and overall health      Relevant Orders   VITAMIN D 25 Hydroxy (Vit-D Deficiency, Fractures) (Completed)

## 2016-05-22 NOTE — Assessment & Plan Note (Signed)
Worsening with time  Family has problems getting her to take medications and bathe She has 24 hour care Husband will not let her be placed  She declines dementia med  For agitation- using temazapem at night  If needed- could change to tramadol  / which also helps this and pain

## 2016-05-22 NOTE — Assessment & Plan Note (Signed)
Reviewed health habits including diet and exercise and skin cancer prevention Reviewed appropriate screening tests for age  Also reviewed health mt list, fam hx and immunization status , as well as social and family history   See HPI  AMW done  Labs drawn today  Declines most health screening due to age and dementia Fall risk addressed Will work on living will

## 2016-05-22 NOTE — Assessment & Plan Note (Signed)
Legally blind  Has stopped going to eye specialist

## 2016-05-22 NOTE — Assessment & Plan Note (Signed)
B12 in labs today  Oral supplementation

## 2016-05-22 NOTE — Assessment & Plan Note (Signed)
bp in fair control at this time  BP Readings from Last 1 Encounters:  05/21/16 110/70   No changes needed Disc lifstyle change with low sodium diet and exercise  Labs today

## 2016-05-22 NOTE — Assessment & Plan Note (Signed)
24 hour care Dementia and blind  Disc fall prev in detail

## 2016-05-22 NOTE — Assessment & Plan Note (Signed)
Some problems with fecal incontinence- may have more to do with dementia  No pain or blood in stool No treatment currently

## 2016-05-22 NOTE — Assessment & Plan Note (Signed)
Level today  Disc imp of D to bones and overall health

## 2016-05-22 NOTE — Assessment & Plan Note (Signed)
Lab today on pravastatin

## 2016-05-22 NOTE — Assessment & Plan Note (Signed)
Reviewed health habits including diet and exercise and skin cancer prevention Reviewed appropriate screening tests for age  Also reviewed health mt list, fam hx and immunization status , as well as social and family history   See HPI Labs drawn today  Will work on living will Declines most screening due to age and dementia  Declines tx for dementia  Declines zoster vaccine

## 2016-05-22 NOTE — Assessment & Plan Note (Signed)
Declines dexa due to dementia Declines tx  On ca and adding more D  Fall risk / one fracture  24 hour care-she needs constant supervision to prev falls

## 2016-06-20 ENCOUNTER — Other Ambulatory Visit: Payer: Self-pay | Admitting: Family Medicine

## 2016-06-21 ENCOUNTER — Other Ambulatory Visit: Payer: Self-pay | Admitting: Family Medicine

## 2016-06-21 NOTE — Telephone Encounter (Signed)
She has ulcerative colitis and has to take it from time to time (due to age no longer sees GI)  Px written for call in   Thanks

## 2016-06-21 NOTE — Telephone Encounter (Signed)
Last OV 05/21/2016-CPE. Medication is not on pt's current medication list. pls advise

## 2016-06-21 NOTE — Telephone Encounter (Signed)
Left refill on voice mail at pharmacy  

## 2016-07-25 ENCOUNTER — Other Ambulatory Visit (INDEPENDENT_AMBULATORY_CARE_PROVIDER_SITE_OTHER): Payer: Medicare Other

## 2016-07-25 ENCOUNTER — Telehealth: Payer: Self-pay | Admitting: Family Medicine

## 2016-07-25 DIAGNOSIS — R350 Frequency of micturition: Secondary | ICD-10-CM

## 2016-07-25 LAB — POC URINALSYSI DIPSTICK (AUTOMATED)
Bilirubin, UA: NEGATIVE
GLUCOSE UA: NEGATIVE
Ketones, UA: NEGATIVE
Leukocytes, UA: NEGATIVE
Nitrite, UA: NEGATIVE
Protein, UA: 15
RBC UA: NEGATIVE
SPEC GRAV UA: 1.015 (ref 1.010–1.025)
UROBILINOGEN UA: 0.2 U/dL
pH, UA: 7.5 (ref 5.0–8.0)

## 2016-07-25 NOTE — Telephone Encounter (Signed)
Ashley House (daughter) called stating she thinks Ashley House has an UTI and wanted to bring a urine sample.  She stated she is going every 15 mins.to bathroom  All night last night and the same for this morning.  She is going to [place] this morning and wanted to know if she could bring this around lunch time

## 2016-07-25 NOTE — Telephone Encounter (Signed)
Daughter notified of AZO and Dr. Marliss Coots comments

## 2016-07-25 NOTE — Telephone Encounter (Signed)
AZO over the counter  If they get the urine in before I leave then please send me that result- and I will try to get abx sent in if I can   (send for cx regardless also) Thanks

## 2016-07-25 NOTE — Telephone Encounter (Signed)
Daughter will drop off a UA sample, she did want me to ask Dr. Glori Bickers is there an Rx or med she can recommend to help pt with the urgency she has until we get the culture back

## 2016-07-25 NOTE — Telephone Encounter (Signed)
I am not in the office  I am fine with a urine sample drop off-please send for culture as well (it is very difficult to get pt to the office) I have appointments all afternoon and may not be on the computer from 2-5 however

## 2016-07-27 LAB — URINE CULTURE

## 2016-11-28 ENCOUNTER — Telehealth: Payer: Self-pay

## 2016-11-28 NOTE — Telephone Encounter (Signed)
Daughter notified of Dr. Marliss Coots comments. And recommendations. She said that pt isn't having regular BMs at all that when they take her to the bathroom every time the check she has "sticky pudding like" BM on her bottom that they have to wipe off. It's dark brown. Daughter said this happens almost every day and only maybe once out the week it doesn't. No matter how many times they take her to the bathroom she has this sticky BM on her bottom they have to wipe off. Daughter said due to pt's mental status they can't tell if she is in pain anywhere. Daughter said that about a year ago you prescribed another medication that caused her stool to become dark so you d/c that medication and told them to give her calcium. Daughter is worried because she said she was told by a GI a long time ago that pt was going to have to be on some type of medication the rest of her life for her colitis but after the last medication you just told her to take calcium and now daughter is worried. I advise her of your recommendation of the prednisone if needed but she said isn't it going to be a taper dose and what should they do going forward after that. Daughter will check just to see if the asacol is covered again but if not she needs to know what the plan will be going forward for her mother even after we give her a taper dose of prednisone

## 2016-11-28 NOTE — Telephone Encounter (Signed)
Because I don't treat this condition- I had messaged Dr Carlean Purl about it - he recommended asacol if it is affordable and otherwise a short course of prednisone for a flare  My hands are tied unless they follow up with GI - and I know she cannot go for an appt So please have them check and see if asacol is an option - I don't have more ideas for chronic care based on his recommendations  Anti diarrheal medicines are probably use able also but I do not want to get her backed up or impacted

## 2016-11-28 NOTE — Telephone Encounter (Signed)
asacol was stopped because her insurance did not cover it any more  If they find out it is covered now - I will be happy to px it  I had talked to Dr Carlean Purl re: what to do for a flare and he proposed a course of prednisone if needed What are her symptoms currently?

## 2016-11-28 NOTE — Telephone Encounter (Signed)
Becky left v/m; pt continues with bowel problem; pt has colitis and Jacqlyn Larsen has spoke with Dr Glori Bickers about this before; Jacqlyn Larsen wants to know why Asacol was stopped and is there any med pt could take for this. Becky request cb.

## 2016-11-29 MED ORDER — MESALAMINE 400 MG PO CPDR: 1200.0000 mg | DELAYED_RELEASE_CAPSULE | Freq: Two times a day (BID) | ORAL | 3 refills | Status: DC

## 2016-11-29 NOTE — Telephone Encounter (Signed)
Daughter checked with the insurance and the asacol is covered again, Dr. Glori Bickers just has to send it to pt's mail order pharmacy [facility]

## 2016-11-29 NOTE — Telephone Encounter (Signed)
I am re starting the dose she was on from GI [facility] Let me know if any problems

## 2016-11-29 NOTE — Telephone Encounter (Signed)
Please call becky may back, [phone]

## 2016-12-02 ENCOUNTER — Telehealth: Payer: Self-pay | Admitting: *Deleted

## 2016-12-02 MED ORDER — MESALAMINE 400 MG PO CPDR: 800.0000 mg | DELAYED_RELEASE_CAPSULE | Freq: Three times a day (TID) | ORAL | 1 refills | Status: DC

## 2016-12-02 NOTE — Telephone Encounter (Signed)
It looks the same /same basic ingredient  It is dosed a bit differently 2 pills tid  Please let caregiver know  I sent it in- should work the same but keep me posted

## 2016-12-02 NOTE — Telephone Encounter (Signed)
Received fax from [facility] saying that the asacol 470m was d/c by manufacturer and is no longer available. I called [facility] and they advise me that the only thing close to this Rx the have available would be Delzicol 4065mand its a delayed release tablet too. If Dr. ToGlori Bickerss okay with the change she can just send in a new Rx electronically

## 2016-12-03 NOTE — Telephone Encounter (Signed)
Pt's daughter notified of Dr. Marliss Coots comments and that new Rx sent to pharmacy

## 2016-12-06 MED ORDER — MESALAMINE 400 MG PO CPDR: 800.0000 mg | DELAYED_RELEASE_CAPSULE | Freq: Three times a day (TID) | ORAL | 1 refills | Status: DC

## 2016-12-06 NOTE — Telephone Encounter (Signed)
Mesalamine was resent in error, I have sent in the delzicol in now

## 2016-12-06 NOTE — Addendum Note (Signed)
Addended by: Tammi Sou on: 12/06/2016 05:00 PM   Modules accepted: Orders

## 2016-12-06 NOTE — Telephone Encounter (Signed)
Let them know I sent delzicol instead- please double check that  Thanks

## 2016-12-06 NOTE — Telephone Encounter (Signed)
[facility] left v/m mesalamine 400 mg has been d/c;[facility] request cb for clarification using ref # [id number]. Request cb within 24 hrs.

## 2016-12-10 ENCOUNTER — Telehealth: Payer: Self-pay

## 2016-12-10 NOTE — Telephone Encounter (Signed)
Patient's daughter calls to report that the Mesalamine will require a prior authorization.  We either need to initiate the PA or use one of the other medications listed below that her insurance will cover (per [facility]):  1.  Sulfasalazine 2. Mesalamine 1.2gm delayed release 3. Balsalazide 4. Apriso 5. Jeannene Patella (daughter thinks this was what she was on after the first one that made her stools dark and loose)  Daughter has requested we call her cell: [phone] when we call back.    Please Advise.

## 2016-12-10 NOTE — Telephone Encounter (Signed)
Please initiate the PA

## 2016-12-11 NOTE — Telephone Encounter (Signed)
I will have to take a look to see - please remind her that since I am not a GI specialist (and I understand it is too difficult to get her back to one) I am not very familiar with them  I was trying to get her back on what her last GI had her on  Will do the best we can  Thanks

## 2016-12-11 NOTE — Telephone Encounter (Signed)
Left message on voicemail for patient's daughter to call back.

## 2016-12-11 NOTE — Telephone Encounter (Signed)
Spoke to patient's daughter and was advised her that we will work on a PA per Dr. Glori Bickers. Patient's daughter stated that she was concerned about the number of times a day the patient would have to take this medication. Patient's daughter wanted to know if one of the medications that the insurance would approve would be given once a da. Patient's daughter stated that is is hard to get patient to take pills more than once a day.

## 2016-12-12 NOTE — Telephone Encounter (Signed)
I'm confused- do I need to do a prior auth? If so please print it out.  Or do I just need to send a px in ?

## 2016-12-12 NOTE — Telephone Encounter (Signed)
Ashley House called back after speaking with [facility] to give [facility] prior-auth dept [id number]. She was told the Dr could call and order the prescription. Call Ashley House with any questions.

## 2016-12-23 MED ORDER — MESALAMINE 1.2 G PO TBEC: 1.2000 g | DELAYED_RELEASE_TABLET | Freq: Every day | ORAL | 3 refills | Status: DC

## 2016-12-23 NOTE — Telephone Encounter (Signed)
Melissa with [facility] called and said pts daughter,Becky upset because no resolve to PA for med. pts daughter request cb ASAP [phone].

## 2016-12-23 NOTE — Telephone Encounter (Signed)
I sent the mesalamine 1.2 g extended release This is the only once daily formulation I could find   If she has the same side effects as the original Lialda then let me know   Hope this helps

## 2016-12-23 NOTE — Telephone Encounter (Signed)
Daughter advised.

## 2016-12-23 NOTE — Addendum Note (Signed)
Addended by: Loura Pardon A on: 12/23/2016 05:18 PM   Modules accepted: Orders

## 2016-12-23 NOTE — Telephone Encounter (Signed)
Called daughter (I was out of the office when this 1st happened) pt's daughter just wants Dr. Glori Bickers to prescribed one of the alt med that is covered through her insurance they she will not have to take so often. The list she gave in the prev phone note was (Sulfasalazine, Mesalamine 1.2g delayed release, Balsalazide, Apriso) the Lilada made her have dark/loose stools. Daughter is requesting that Dr. Glori Bickers send in an alt med ASAP she is willing to try anything at this point just to see if it will give pt some relief because pt is having sticky dark stool all day, please send in an Rx that pt doesn't have to take multiple times per day

## 2016-12-27 ENCOUNTER — Other Ambulatory Visit: Payer: Self-pay | Admitting: *Deleted

## 2016-12-27 MED ORDER — TEMAZEPAM 15 MG PO CAPS: 15.0000 mg | ORAL_CAPSULE | Freq: Every evening | ORAL | 0 refills | Status: DC | PRN

## 2016-12-27 NOTE — Telephone Encounter (Signed)
Px written for call in   Use with caution of sedation and falls

## 2016-12-27 NOTE — Telephone Encounter (Signed)
CPE 05/21/16, last filled on 05/21/16 #90 caps with 0 refills, please advise

## 2016-12-30 MED ORDER — TEMAZEPAM 15 MG PO CAPS: 15.0000 mg | ORAL_CAPSULE | Freq: Every evening | ORAL | 0 refills | Status: DC | PRN

## 2016-12-30 NOTE — Telephone Encounter (Signed)
Since pharmacy is a mail order, Rx needs to be printed and faxed, Rx printed and placed in Dr. Marliss Coots inbox sign so I can fax it to [facility]

## 2016-12-30 NOTE — Telephone Encounter (Signed)
Done and in IN box 

## 2016-12-30 NOTE — Telephone Encounter (Signed)
Rx faxed

## 2017-01-02 ENCOUNTER — Telehealth: Payer: Self-pay | Admitting: Family Medicine

## 2017-01-02 NOTE — Telephone Encounter (Signed)
I realized that it is the same med, but I don't see where she has been on anything else.   She was listed in the EMR has having been on this med for years, starting back in ~2013.   The notes I see are related to coverage and med substitution. I need more details about her current and recent situation.   My main question is this- is the current situation from the med or from lack of effect from the med? Has she been on mesalamine recently? If she is having black runny stools, that could be from lack of effect/incomplete treatment.    What was her situation prior to starting the med?  Is it any different on the med?   Does she have abd pain/fever? Let me know and we'll try to go from there but it is difficult to make a decision re: her meds at this point.    Thanks.

## 2017-01-02 NOTE — Telephone Encounter (Signed)
Daughter says that this medication is the generic form of the medication that caused so much trouble in the past.

## 2017-01-02 NOTE — Telephone Encounter (Signed)
Copied from [place] [id number]. Topic: Inquiry >> Jan 02, 2017 11:43 AM Oliver Pila B wrote: Reason for CRM: Becky May (daughter) of PT is concerned b/c the mesalamine is the same drug that gives her runny black stools and is wondering why she put her back on it, there is a list of drugs that Jacqlyn Larsen gave to a nurse that [facility] gave her. She is needing another drug if its the same one that gives her runny black stools

## 2017-01-02 NOTE — Telephone Encounter (Signed)
Please see note from daughter with concern about mesalamine.

## 2017-01-02 NOTE — Telephone Encounter (Signed)
Please let them know I need to check the notes and we'll be in touch.

## 2017-01-03 NOTE — Telephone Encounter (Signed)
[phone] Jerilynn Mages)  Jacqlyn Larsen, daughter.

## 2017-01-05 DIAGNOSIS — S63501A Unspecified sprain of right wrist, initial encounter: Secondary | ICD-10-CM | POA: Diagnosis not present

## 2017-01-06 NOTE — Telephone Encounter (Signed)
Many thanks.

## 2017-01-06 NOTE — Telephone Encounter (Signed)
We will have her seen in our office  RN staff - please arrange appt me or APP - let me know when and whom so I can discuss w/ them

## 2017-01-06 NOTE — Telephone Encounter (Signed)
The patient cannot provide any history. I had a long phone conversation with her daughter.  Dr. Glori Bickers is out of the office on sick leave.    The patient was on Lialda for years, back in 2013 and from that point on. She had black stools attributed to the Lialda and she stopped the medication. This was in the distant past. Her stools were okay until about 6 months ago. At that point she had looser stools and was started on calcium to see if that would help. She failed treatment with calcium. She has been having dark stools that are pudding consistency. No blood in stool. Stools are dark brown. No black tarry stools.   She is not on any treatment for colitis currently. Her situation is complicated by dementia. The patient can evidently not give any adequate history. She will sometimes complain of abdominal pain but then will complain of other pains later on. It is unclear to me if she should start a medication for ulcerative colitis at this point. Even if she should start a medicine, it is unclear to me what medicine she should start. I need GI input on this.  I routed this to Dr. Carlean Purl with appreciation.

## 2017-01-06 NOTE — Telephone Encounter (Signed)
Patient scheduled with Nicoletta Ba PA 01/14/17.  Patient's daughter offered an earlier appt, but she can't come due to recent fall this weekend.

## 2017-01-14 ENCOUNTER — Encounter (INDEPENDENT_AMBULATORY_CARE_PROVIDER_SITE_OTHER): Payer: Self-pay

## 2017-01-14 ENCOUNTER — Other Ambulatory Visit (INDEPENDENT_AMBULATORY_CARE_PROVIDER_SITE_OTHER): Payer: Medicare Other

## 2017-01-14 ENCOUNTER — Encounter: Payer: Self-pay | Admitting: Physician Assistant

## 2017-01-14 ENCOUNTER — Ambulatory Visit: Payer: Medicare Other | Admitting: Physician Assistant

## 2017-01-14 VITALS — BP 110/60 | HR 77 | Ht 62.0 in | Wt 118.0 lb

## 2017-01-14 DIAGNOSIS — K51919 Ulcerative colitis, unspecified with unspecified complications: Secondary | ICD-10-CM

## 2017-01-14 LAB — CBC WITH DIFFERENTIAL/PLATELET
BASOS PCT: 1.2 % (ref 0.0–3.0)
Basophils Absolute: 0.1 10*3/uL (ref 0.0–0.1)
EOS PCT: 3 % (ref 0.0–5.0)
Eosinophils Absolute: 0.2 10*3/uL (ref 0.0–0.7)
HEMATOCRIT: 37.9 % (ref 36.0–46.0)
HEMOGLOBIN: 12.4 g/dL (ref 12.0–15.0)
LYMPHS PCT: 34.2 % (ref 12.0–46.0)
Lymphs Abs: 2.7 10*3/uL (ref 0.7–4.0)
MCHC: 32.8 g/dL (ref 30.0–36.0)
MCV: 85.1 fl (ref 78.0–100.0)
MONO ABS: 0.6 10*3/uL (ref 0.1–1.0)
Monocytes Relative: 8.3 % (ref 3.0–12.0)
Neutro Abs: 4.2 10*3/uL (ref 1.4–7.7)
Neutrophils Relative %: 53.3 % (ref 43.0–77.0)
Platelets: 367 10*3/uL (ref 150.0–400.0)
RBC: 4.45 Mil/uL (ref 3.87–5.11)
RDW: 14.2 % (ref 11.5–15.5)
WBC: 7.9 10*3/uL (ref 4.0–10.5)

## 2017-01-14 LAB — SEDIMENTATION RATE: Sed Rate: 65 mm/hr — ABNORMAL HIGH (ref 0–30)

## 2017-01-14 LAB — HIGH SENSITIVITY CRP: CRP HIGH SENSITIVITY: 2.82 mg/L (ref 0.000–5.000)

## 2017-01-14 MED ORDER — MESALAMINE ER 0.375 G PO CP24: ORAL_CAPSULE | ORAL | 6 refills | Status: DC

## 2017-01-14 NOTE — Patient Instructions (Addendum)
We will call with lab results.  We sent Apriso 0.375 mg to [facility].  Call us if this is too expensive.  Call [phone], choose option 2. Ask for Earsel Shouse.

## 2017-01-15 ENCOUNTER — Encounter: Payer: Self-pay | Admitting: Physician Assistant

## 2017-01-15 NOTE — Progress Notes (Addendum)
Subjective:    Patient ID: Ashley House, female    DOB: [birth year], 83 y.o.   MRN: [id number]  HPI Zayli is a 83 year old white female, known to Dr. Carlean Purl but not seen in our office since 2013. She comes in today, brought in by her daughter who is her caregiver with concerns for ongoing problems with loose to mushy stools and intermittent very dark to black stools. Patient has history of hypertension, dementia, osteoarthritis, anxiety, diverticulosis and left-sided ulcerative colitis. She has not been on any medication over the past 5-6 years. Last colonoscopy was done in December 2008 with finding of left-sided colitis, internal hemorrhoids, large external hemorrhoids and hypertrophied anal papillae. Path from left colon consistent with chronic active colitis. She had been treated with Asacol for several years, and her daughter states that she did well with that. At some point she was switched to [facility] which she did not tolerate well and caused increased diarrhea. That was stopped and she has not been on any other medication since. Her daughter says that Asacol is very expensive with her current insurance. She believes there are a couple of other medications that would be covered by her insurance but she is not sure what these are. Patient is not able to give any accurate history due to her dementia but her daughter says she says that she's "sick" frequently and were rolled her stomach. She also has consistently very mushy stool. Patient is incontinent of stool and her daughter says there is always stool present any time she urinates etc.and  that she may have some sort of bowel movements several times a day. She's not having frank liquid diarrhea and no obvious blood or mucus noted. She says her stools are dark at time and she's not sure if there may be some blood present.  Review of Systems Pertinent positive and negative review of systems were noted in the above HPI section.  All other review  of systems was otherwise negative.  Outpatient Encounter Medications as of 01/14/2017  Medication Sig  . amLODipine (NORVASC) 5 MG tablet Take 1 tablet (5 mg total) by mouth daily.  . Cholecalciferol (VITAMIN D) 2000 UNITS CAPS Take 1 capsule by mouth daily.   . Cyanocobalamin (VITAMIN B 12 PO) Take 1,000 mg by mouth.  . pravastatin (PRAVACHOL) 40 MG tablet Take 1 tablet (40 mg total) by mouth daily.  . mesalamine (APRISO) 0.375 g 24 hr capsule Take 2 tablets twice daily  . [DISCONTINUED] diphenoxylate-atropine (LOMOTIL) 2.5-0.025 MG tablet TAKE 1 TABLET BY MOUTH 4 TIMES A DAY AS NEEDED FOR DIARRHEA OR LOOSE STOOLS (Patient not taking: Reported on 01/14/2017)  . [DISCONTINUED] mesalamine (LIALDA) 1.2 g EC tablet Take 1 tablet (1.2 g total) by mouth daily with breakfast. (Patient not taking: Reported on 01/14/2017)  . [DISCONTINUED] temazepam (RESTORIL) 15 MG capsule Take 1 capsule (15 mg total) at bedtime as needed by mouth for sleep. (Patient not taking: Reported on 01/14/2017)   No facility-administered encounter medications on file as of 01/14/2017.    Allergies  Allergen Reactions  . Ace Inhibitors   . Atorvastatin   . Ezetimibe-Simvastatin   . Guanfacine Hcl   . Tolterodine Tartrate     REACTION: does not work   Patient Active Problem List   Diagnosis Date Noted  . Routine general medical examination at a health care facility 05/21/2016  . Medicare annual wellness visit, subsequent 05/21/2016  . Hypokalemia 09/01/2012  . Frequent falls 08/05/2012  . Vitamin D  deficiency 04/02/2010  . ANXIETY DISORDER 04/02/2010  . B12 deficiency 11/09/2009  . Senile dementia with behavioral disturbance 10/09/2009  . FREQUENCY, URINARY 12/11/2007  . ANEMIA 05/27/2007  . Left sided ulcerative (chronic) colitis ([place]) 03/06/2007  . Macular degeneration (senile) of retina 03/05/2007  . ALLERGIC RHINITIS 03/05/2007  . DIVERTICULOSIS, COLON 03/05/2007  . UTI'S, RECURRENT 03/05/2007  .  OSTEOARTHRITIS 03/05/2007  . Osteoporosis 03/05/2007  . External hemorrhoids without mention of complication 32/91/9166  . HYPERCHOLESTEROLEMIA, PURE 11/26/2006  . HYPERTENSION, BENIGN ESSENTIAL 11/26/2006   Social History   Socioeconomic History  . Marital status: Married    Spouse name: Not on file  . Number of children: 4                                Occupational History  . Occupation: Retired    Fish farm manager: retired  Tobacco Use  . Smoking status: Never Smoker  . Smokeless tobacco: Current User    Types: Snuff  Substance and Sexual Activity  . Alcohol use: No    Alcohol/week: 0.0 oz  . Drug use: No   Ms. Garlow's family history includes Cancer in her brother; Kidney disease in her mother.      Objective:    Vitals:   01/14/17 1332  BP: 110/60  Pulse: 77  SpO2: 94%    Physical Exam  well-developed elderly white female, in no acute distress, sitting in a wheelchair, she is accompanied by her daughter and granddaughter. Patient repeatedly expressing desire to leave and stating she doesn't know why she is here. HEENT ;nontraumatic normocephalic EOMI PERRLA sclera anicteric, Cardiovascular; regular rate and rhythm with S1-S2 no murmur or gallop, Pulmonary; clear bilaterally, Abdomen ;soft, no palpable mass or hepatosplenomegaly, bowel sounds are present, she does have some tenderness in the left mid and left lower quadrant no guarding, Rectal ;exam external hemorrhoidal tags, there is a lot of soft stool in the rectal vault no impaction stool heme negative. Extremities ;no clubbing cyanosis or edema skin warm and dry, Neuropsych; mood and affect consistent with dementia, for the most part cooperative.       Assessment & Plan:   #[age] year old white female with significant dementia and history of left-sided ulcerative colitis last documented on colonoscopy in 2008. Patient had been successfully managed with Asacol in the past, and was intolerant to Lialda. She has not  been on any medication over the past 5 years. She has ongoing frequent mushy stools and intermittent dark stools-heme-negative today. There is left-sided tenderness on exam I believe she is likely does have some active left-sided ulcerative colitis. #2 history of diverticulosis #3 anxiety #4 osteoarthritis #5 hypertension #6 dementia as outlined above  Plan; CBC with differential, and sedimentation rate today We will start her on Apriso 0.375 grams, 2 tablets by mouth twice a day. Will plan to follow-up in the office in 6 weeks with Dr. Carlean Purl or myself. Family knows to call in the antrum for any problems or questions.  Amy S Esterwood PA-C 01/15/2017   Cc: Tower, Wynelle Fanny, MD

## 2017-04-09 ENCOUNTER — Telehealth: Payer: Self-pay | Admitting: Family Medicine

## 2017-04-09 MED ORDER — TRAMADOL HCL 50 MG PO TABS: 50.0000 mg | ORAL_TABLET | Freq: Every evening | ORAL | 3 refills | Status: DC | PRN

## 2017-04-09 NOTE — Telephone Encounter (Signed)
Daughter Jacqlyn Larsen May notified Rx sent and advised of Dr. Marliss Coots comments

## 2017-04-09 NOTE — Telephone Encounter (Signed)
I sent it  Please use great caution as (with any pain or sedating medications) it can cause falls

## 2017-04-09 NOTE — Telephone Encounter (Signed)
Copied from [place]. Topic: Quick Communication - See Telephone Encounter >> Apr 09, 2017  9:12 AM Cleaster Corin, NT wrote: CRM for notification. See Telephone encounter for:   04/09/17.pt. Daughter Jacqlyn Larsen may calling to speak with Dr. Glori Bickers or nurse concerning pt. Dementia. Jacqlyn Larsen can be reached at [phone]

## 2017-04-09 NOTE — Telephone Encounter (Signed)
I spoke with Jacqlyn Larsen (DPR signed); No sleep med previously prescribed has helped including benadryl. Jacqlyn Larsen said when pt broke her shoulder Tramadol helped pt sleep. Jacqlyn Larsen said the other night pt was complaining with leg pain and was given tramadol; pt slept well all night. For the last 2 nights pt has taken tramadol when going to bed for various pain complaints and pt has slept well only getting up x 1 to go to bathroom; pt is not staggery or having problems walking when takes tramadol. Jacqlyn Larsen wants to know if tramadol can be sent to [facility]. Becky request cb.

## 2017-04-13 ENCOUNTER — Other Ambulatory Visit: Payer: Self-pay | Admitting: Family Medicine

## 2017-04-14 NOTE — Telephone Encounter (Signed)
Please schedule f/u April or later  Understand it is difficult to ger her here  We can do labs that day Refill medicines until then

## 2017-04-14 NOTE — Telephone Encounter (Signed)
No recent or future appts., please advise  

## 2017-05-03 ENCOUNTER — Other Ambulatory Visit: Payer: Self-pay | Admitting: Physician Assistant

## 2017-05-23 IMAGING — DX DG HUMERUS 2V *L*
2 series · 2 of 2 positions shown · non-contrast
Comparison: None.

CLINICAL DATA: Status post fall, left shoulder pain

EXAM:
LEFT HUMERUS - 2+ VIEW

[humerus lat]
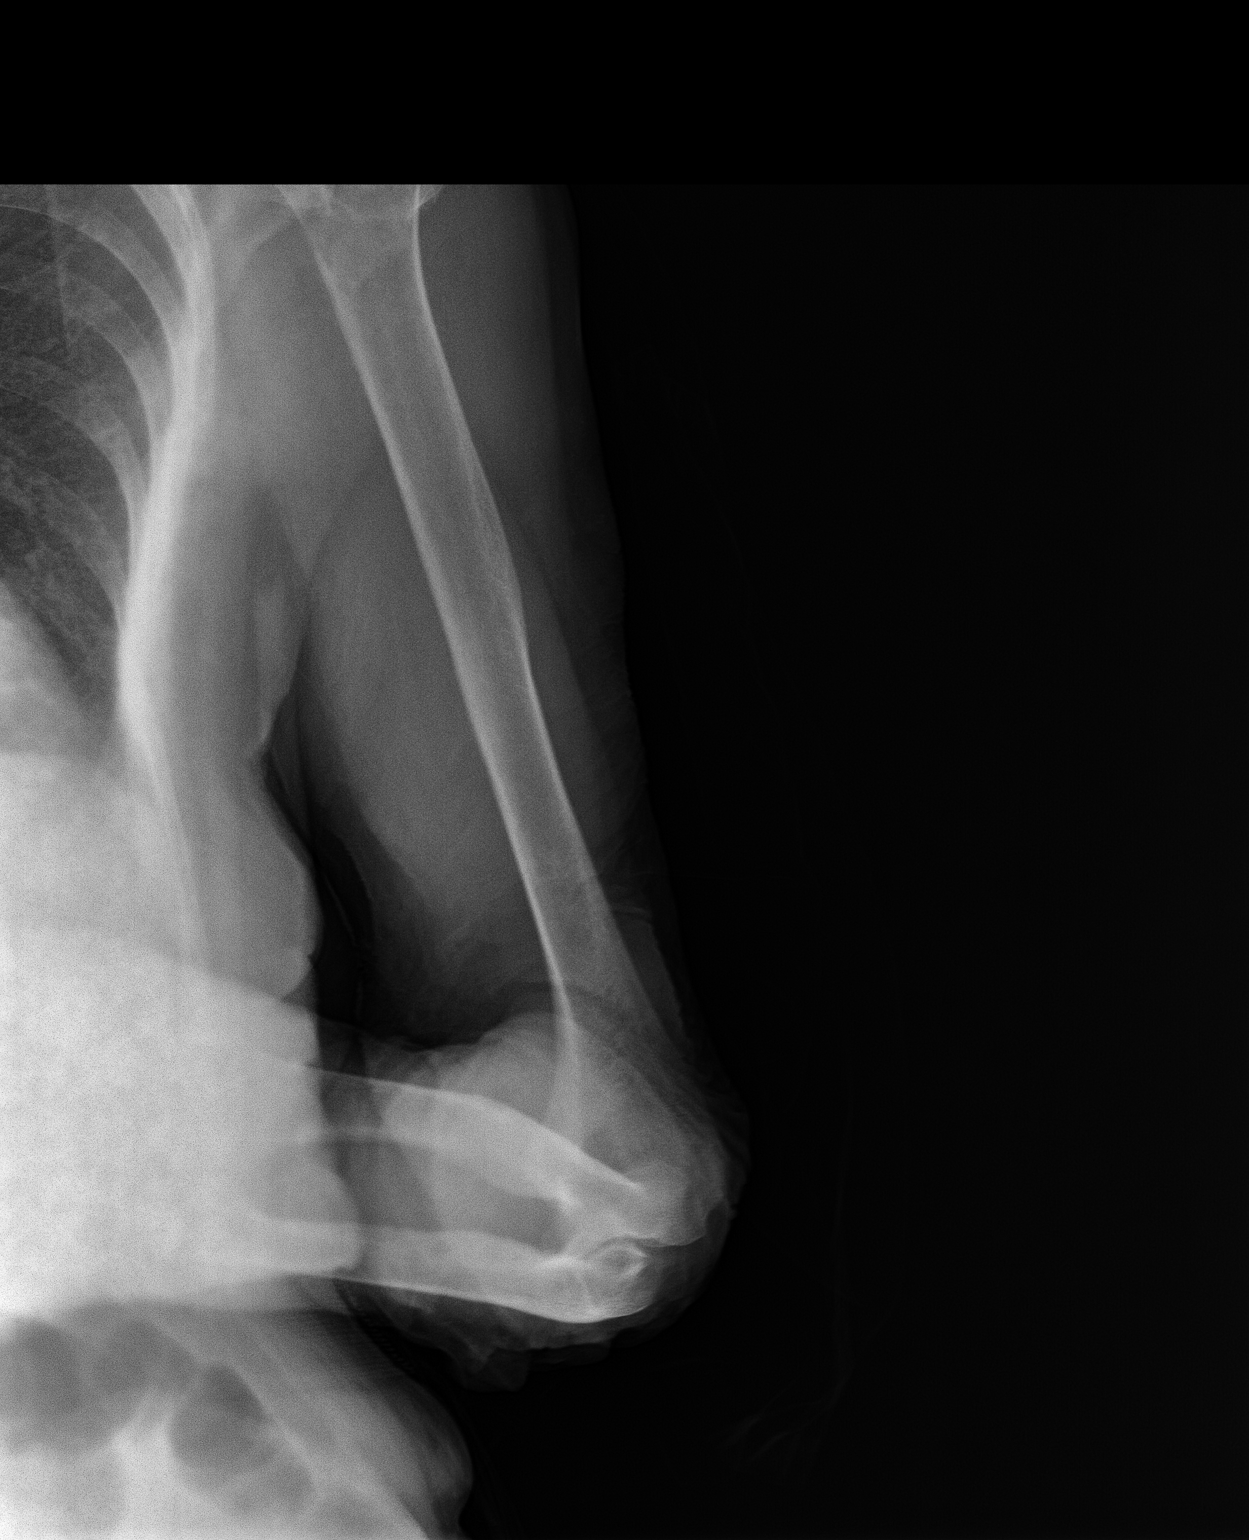

[humerus ap]
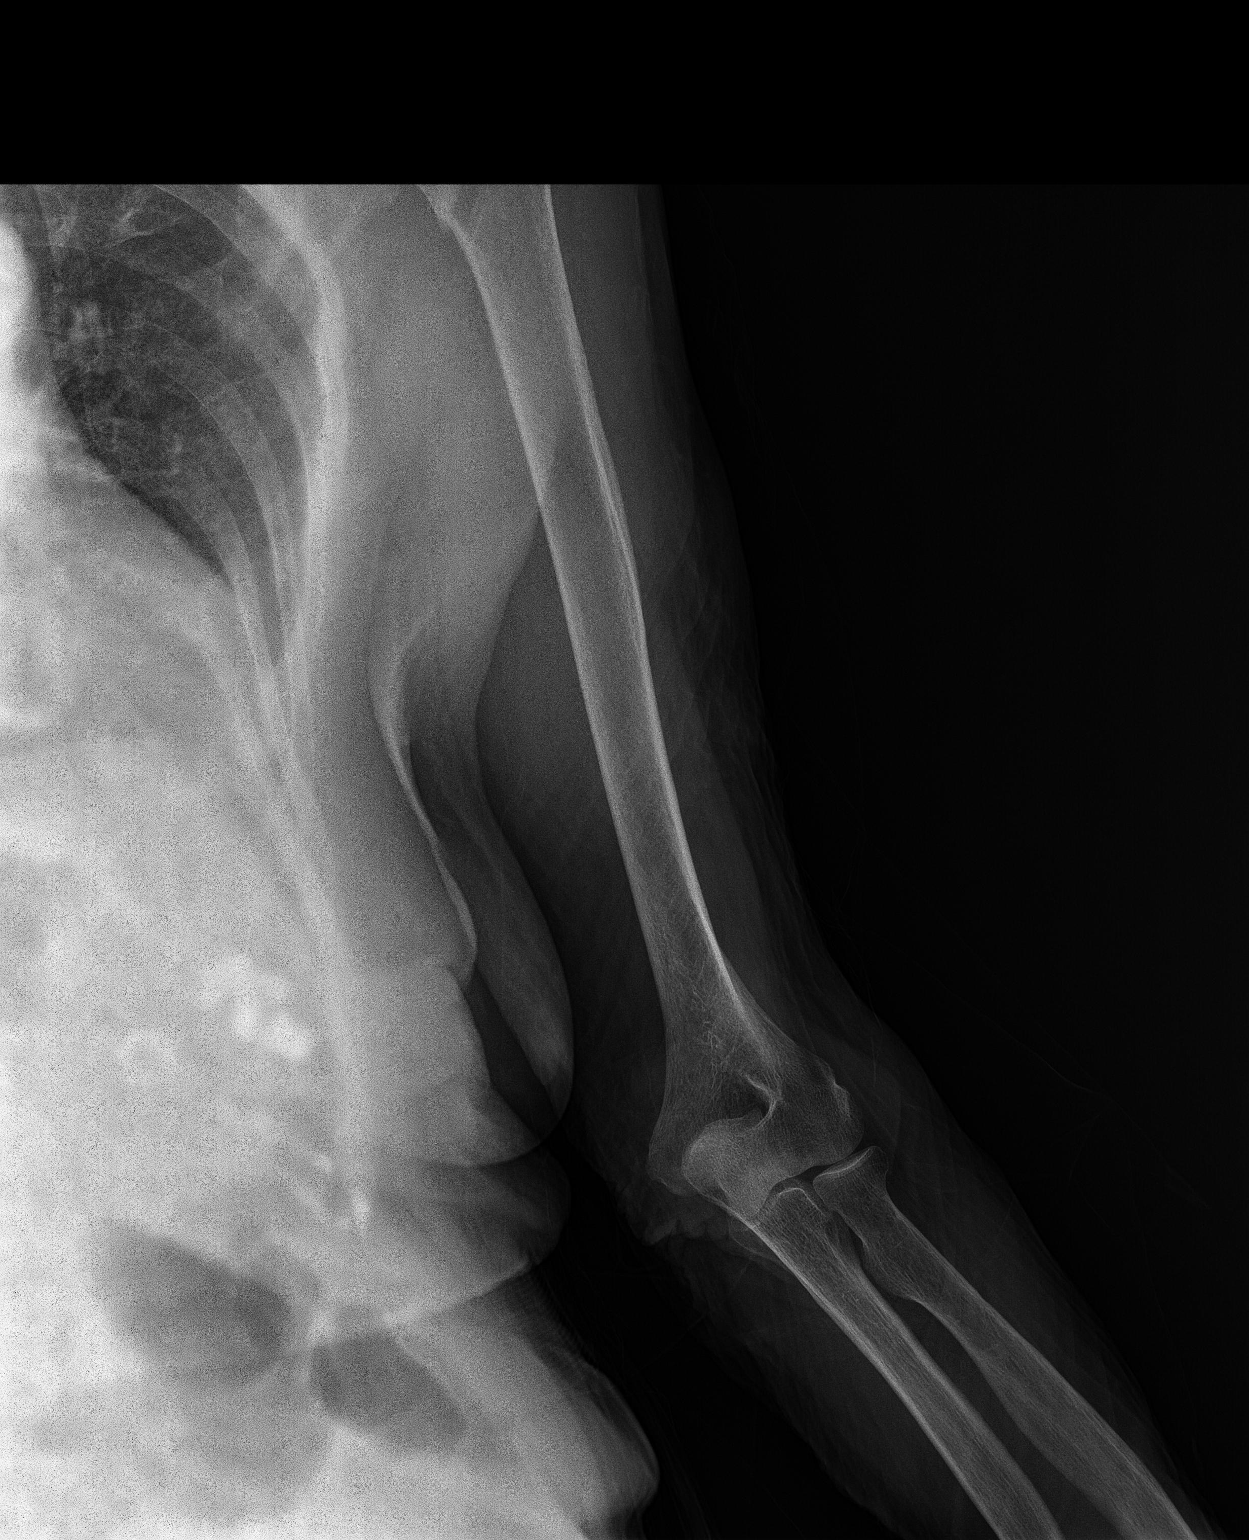

[2 of 2 positions shown; findings below may reference images not displayed]

FINDINGS: Mildly comminuted fracture of the surgical neck of the left proximal
humerus. The fracture involves the greater tuberosity. No
glenohumeral dislocation. Mild degenerative changes of the
acromioclavicular joint. No aggressive lytic or sclerotic osseous
lesion.
IMPRESSION: Mildly comminuted fracture of the surgical neck of the left proximal
humerus.

## 2017-05-23 IMAGING — DX DG SHOULDER 2+V*L*
2 series · 2 of 2 positions shown · non-contrast
Comparison: None.

CLINICAL DATA: Left shoulder pain status post fall

EXAM:
LEFT SHOULDER - 2+ VIEW

[shoulder ap]
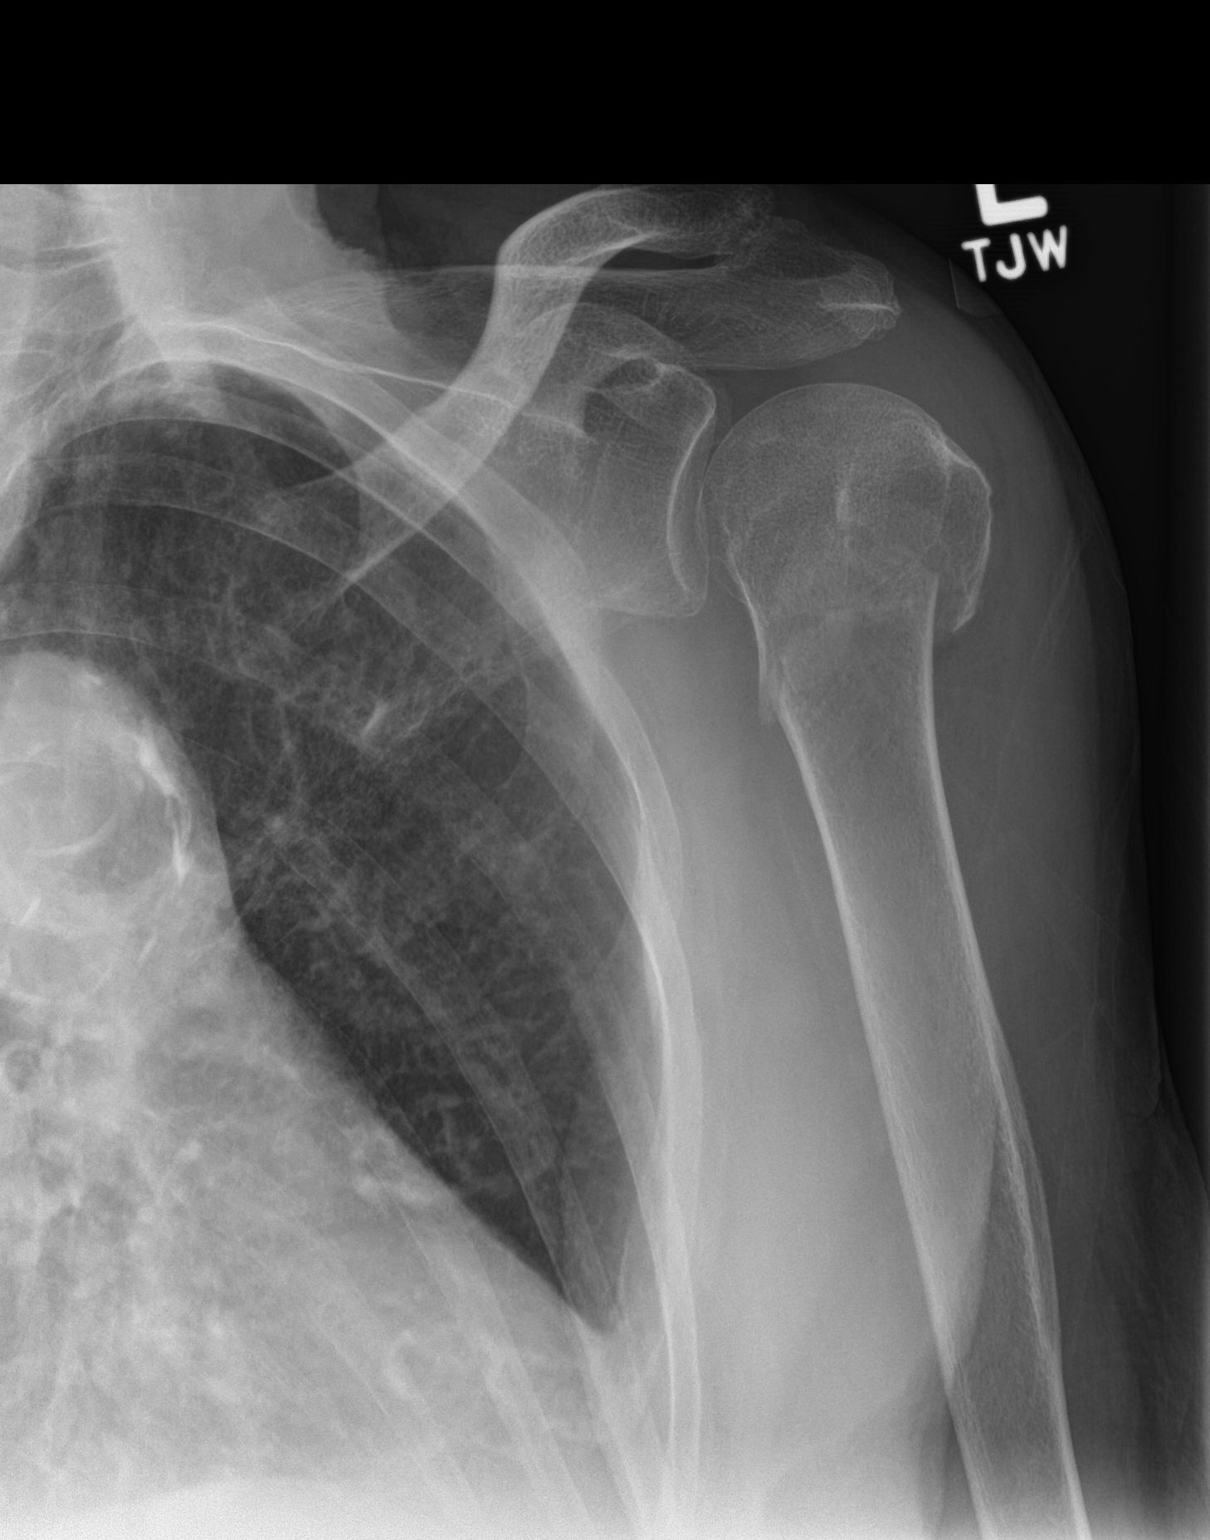

[shoulder y-view]
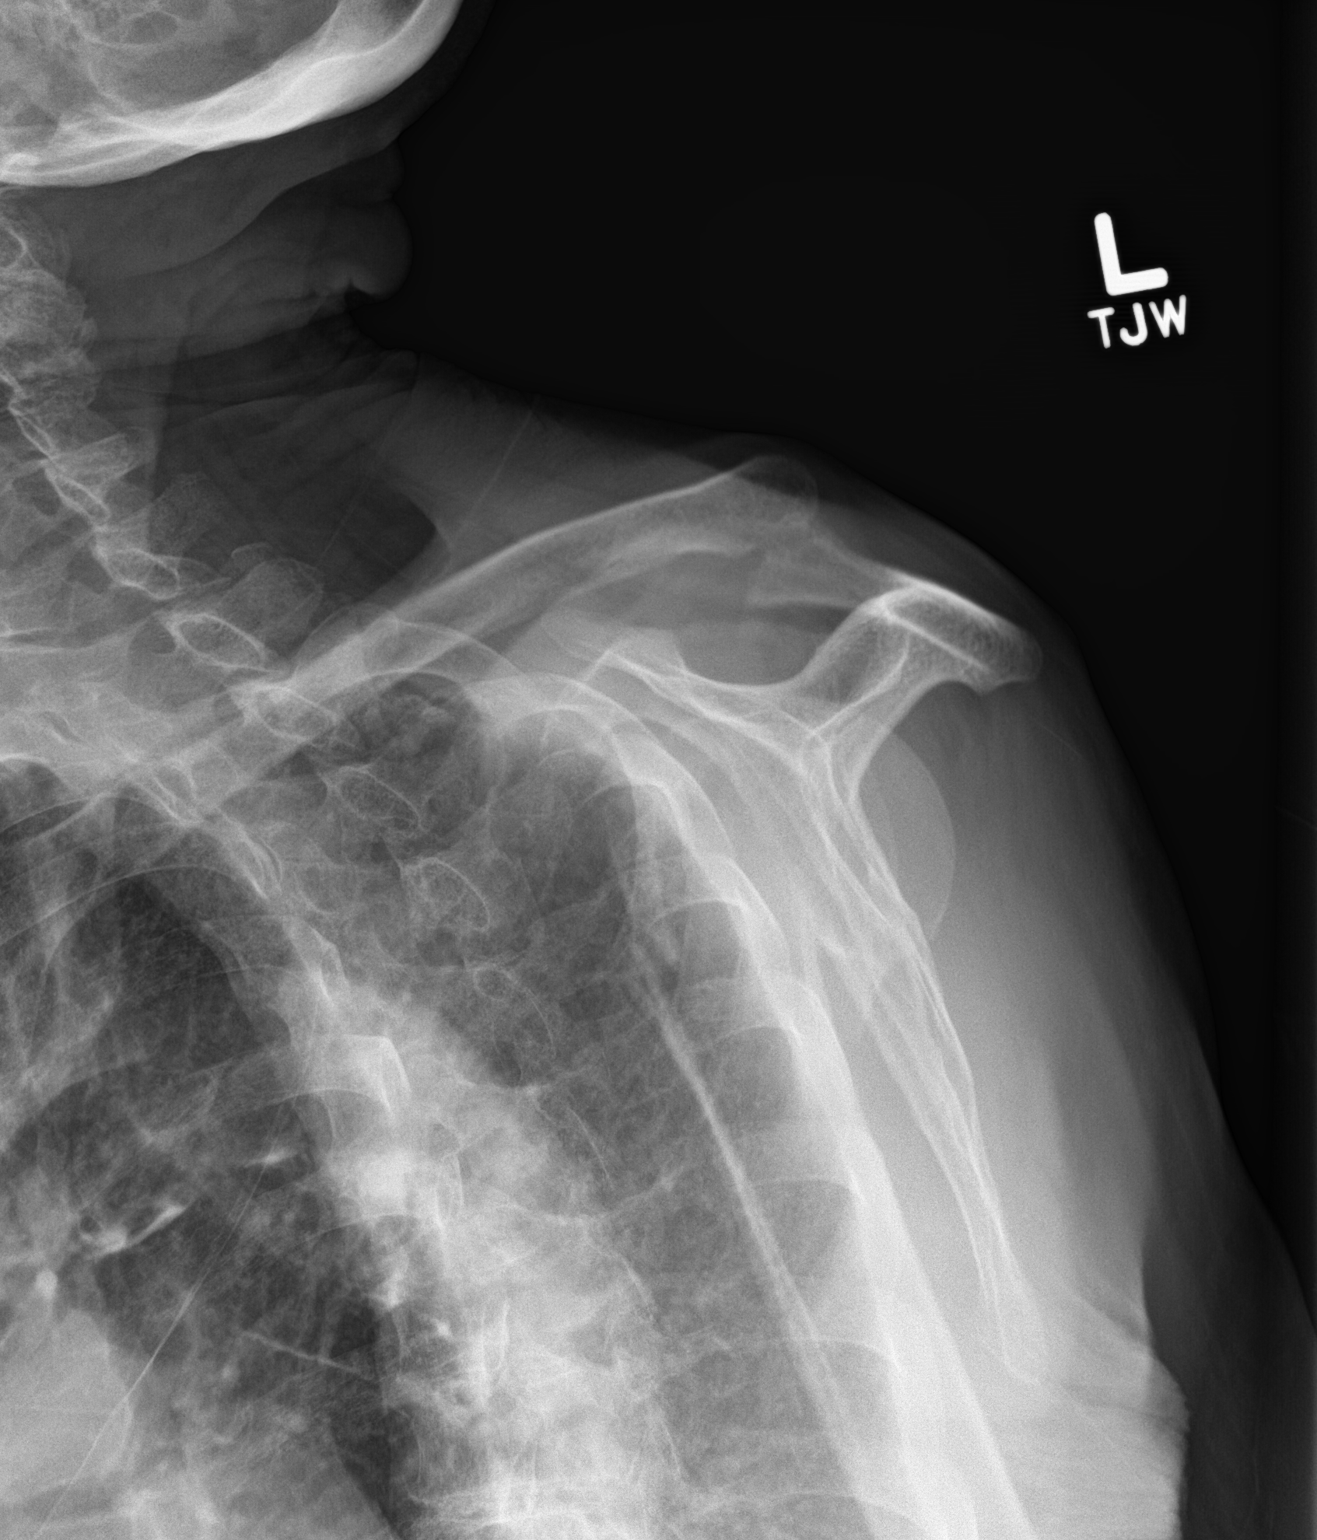

[2 of 2 positions shown; findings below may reference images not displayed]

FINDINGS: Mildly comminuted fracture of the surgical neck of the left proximal
humerus. The fracture involves the greater tuberosity. No
glenohumeral dislocation. Mild degenerative changes of the
acromioclavicular joint. No aggressive lytic or sclerotic osseous
lesion.
IMPRESSION: 1. Mildly comminuted fracture of the surgical neck of the left
proximal humerus.

## 2017-06-16 ENCOUNTER — Ambulatory Visit (INDEPENDENT_AMBULATORY_CARE_PROVIDER_SITE_OTHER): Payer: Medicare Other | Admitting: Family Medicine

## 2017-06-16 ENCOUNTER — Encounter: Payer: Self-pay | Admitting: Family Medicine

## 2017-06-16 VITALS — BP 144/78 | HR 78 | Temp 98.2°F | Ht 62.0 in | Wt 124.5 lb

## 2017-06-16 DIAGNOSIS — E7800 Pure hypercholesterolemia, unspecified: Secondary | ICD-10-CM | POA: Diagnosis not present

## 2017-06-16 DIAGNOSIS — K5150 Left sided colitis without complications: Secondary | ICD-10-CM | POA: Diagnosis not present

## 2017-06-16 DIAGNOSIS — E559 Vitamin D deficiency, unspecified: Secondary | ICD-10-CM | POA: Diagnosis not present

## 2017-06-16 DIAGNOSIS — F0390 Unspecified dementia without behavioral disturbance: Secondary | ICD-10-CM | POA: Diagnosis not present

## 2017-06-16 DIAGNOSIS — E538 Deficiency of other specified B group vitamins: Secondary | ICD-10-CM | POA: Diagnosis not present

## 2017-06-16 DIAGNOSIS — F03C0 Unspecified dementia, severe, without behavioral disturbance, psychotic disturbance, mood disturbance, and anxiety: Secondary | ICD-10-CM

## 2017-06-16 DIAGNOSIS — I10 Essential (primary) hypertension: Secondary | ICD-10-CM

## 2017-06-16 MED ORDER — AMLODIPINE BESYLATE 5 MG PO TABS: 5.0000 mg | ORAL_TABLET | Freq: Every day | ORAL | 3 refills | Status: AC

## 2017-06-16 MED ORDER — PRAVASTATIN SODIUM 40 MG PO TABS
40.0000 mg | ORAL_TABLET | Freq: Every day | ORAL | 3 refills | Status: DC
Start: 2017-06-16 — End: 2017-07-18

## 2017-06-16 NOTE — Assessment & Plan Note (Signed)
Due for labs Family gives pravastatin when she will take medication  Disc diet  Appetite is good- at her age do not want to limit it

## 2017-06-16 NOTE — Assessment & Plan Note (Signed)
Family can get her to take a fraction of px mesalamine and it does help diarrhea No c/o of abd pain  Appetite is good most of the time Weight is back up  Continue to follow  Unable to get her to a GI appt

## 2017-06-16 NOTE — Assessment & Plan Note (Signed)
Level today  Has supplement when she accepts medication

## 2017-06-16 NOTE — Progress Notes (Signed)
Subjective:    Patient ID: Ashley House, female    DOB: November 23, 1918, 84 y.o.   MRN: [id number]  HPI Here for f/u of chronic problems   Hx of dementia and macular degeneration  Not a lot of change  Less agitated - a little more calm   Husband is there with her  Family stays at night to help-has to have someone up to prevent wandering     Problems sleeping-temazapem did not help much  Now takes tramadol (works as well as anything) Still gets up a lot at time -occ gets agitated (more calm than she used to be however   Day time-wants to sleep and lie around  Requires help depending on the day for ADLS  Is more confused-not as lucid   Her husband is not willing to put her in nursing care at this time    Wt Readings from Last 3 Encounters:  06/16/17 124 lb 8 oz (56.5 kg)  01/14/17 118 lb (53.5 kg)  05/21/16 125 lb 8 oz (56.9 kg)  appetite is not a problem (is up or down different day)  22.77 kg/m    bp is slightly up due to agitation (getting here) today  No cp or palpitations or headaches or edema  No side effects to medicines -sometimes refuses to take them (amlodipine)  BP Readings from Last 3 Encounters:  06/16/17 (!) 144/78  01/14/17 110/60  05/21/16 110/70      Vit D def B12 def  Taking both most of the time  Does at times refuse to take her medicine   Hyperlipidemia Lab Results  Component Value Date   CHOL 188 05/21/2016   HDL 52.50 05/21/2016   LDLCALC 110 (H) 05/21/2016   LDLDIRECT 113.9 04/02/2010   TRIG 128.0 05/21/2016   CHOLHDL 4 05/21/2016   Pravastatin - sometimes refuses to take   Ulcerative colitis  Now on mesalamine 0.375  It is helping  Not as constant diarrhea/stools - loose at times /now a little more formed  Is incontinent of stool /wears undergarments  Hard to get her to swallow pills (and they are large)  Dropped back to one at night - all she will take (supposed to take 2 bid)  Daughter has discussed this with Dr  Carlean Purl  Patient Active Problem List   Diagnosis Date Noted  . Routine general medical examination at a health care facility 05/21/2016  . Medicare annual wellness visit, subsequent 05/21/2016  . Hypokalemia 09/01/2012  . Frequent falls 08/05/2012  . Vitamin D deficiency 04/02/2010  . ANXIETY DISORDER 04/02/2010  . B12 deficiency 11/09/2009  . Senile dementia with behavioral disturbance 10/09/2009  . FREQUENCY, URINARY 12/11/2007  . ANEMIA 05/27/2007  . Left sided ulcerative (chronic) colitis ([place]) 03/06/2007  . Macular degeneration (senile) of retina 03/05/2007  . ALLERGIC RHINITIS 03/05/2007  . DIVERTICULOSIS, COLON 03/05/2007  . UTI'S, RECURRENT 03/05/2007  . OSTEOARTHRITIS 03/05/2007  . Osteoporosis 03/05/2007  . External hemorrhoids without mention of complication 29/03/1113  . HYPERCHOLESTEROLEMIA, PURE 11/26/2006  . HYPERTENSION, BENIGN ESSENTIAL 11/26/2006   Past Medical History:  Diagnosis Date  . Allergy   . Anemia   . DDD (degenerative disc disease)   . Dementia, senile   . Diverticulosis   . Edema   . Full dentures   . Gastritis   . Hypertension   . Macular degeneration   . Macular degeneration    no center vision  . Osteoporosis   . Overactive bladder  Past Surgical History:  Procedure Laterality Date  . APPLICATION OF A-CELL OF EXTREMITY Right 05/13/2014   Procedure: APPLICATION OF A-CELL OF EXTREMITY;  Surgeon: Irene Limbo, MD;  Location: [facility];  Service: Plastics;  Laterality: Right;  . CATARACT EXTRACTION    . COLONOSCOPY    . LESION REMOVAL Right 05/13/2014   Procedure: EXCISION MALIGNANT LESION RIGHT LEG 4 CM/APPLICATION OF ACCELL;  Surgeon: Irene Limbo, MD;  Location: [facility];  Service: Plastics;  Laterality: Right;   Social History   Tobacco Use  . Smoking status: Never Smoker  . Smokeless tobacco: Former Network engineer Use Topics  . Alcohol use: No    Alcohol/week: 0.0 oz  . Drug use: No    Family History  Problem Relation Age of Onset  . Kidney disease Mother   . Cancer Brother        colon   Allergies  Allergen Reactions  . Ace Inhibitors   . Atorvastatin   . Ezetimibe-Simvastatin   . Guanfacine Hcl   . Tolterodine Tartrate     REACTION: does not work   Current Outpatient Medications on File Prior to Visit  Medication Sig Dispense Refill  . Cholecalciferol (VITAMIN D) 2000 UNITS CAPS Take 1 capsule by mouth daily.     . Cyanocobalamin (VITAMIN B 12 PO) Take 1,000 mg by mouth.    . mesalamine (APRISO) 0.375 g 24 hr capsule TAKE 2 CAPSULES BY MOUTH  TWICE A DAY 360 capsule 3  . traMADol (ULTRAM) 50 MG tablet Take 1 tablet (50 mg total) by mouth at bedtime as needed for moderate pain or severe pain. Caution of sedation 30 tablet 3   No current facility-administered medications on file prior to visit.     Review of Systems  Constitutional: Positive for fatigue. Negative for activity change, appetite change, fever and unexpected weight change.  HENT: Negative for congestion, ear pain, rhinorrhea, sinus pressure and sore throat.   Eyes: Positive for visual disturbance. Negative for pain and redness.       Mostly blind due to macular degeneration   Respiratory: Negative for cough, shortness of breath and wheezing.   Cardiovascular: Negative for chest pain and palpitations.  Gastrointestinal: Positive for diarrhea. Negative for abdominal pain, blood in stool, constipation, nausea, rectal pain and vomiting.       Diarrhea is intermittent with dosing of medication/ depends if pt will take it (ulcerative colitis)  Endocrine: Negative for polydipsia and polyuria.  Genitourinary: Negative for dysuria, frequency and urgency.  Musculoskeletal: Positive for arthralgias. Negative for back pain and myalgias.  Skin: Negative for pallor and rash.  Allergic/Immunologic: Negative for environmental allergies.  Neurological: Negative for dizziness, syncope and headaches.   Hematological: Negative for adenopathy. Does not bruise/bleed easily.  Psychiatric/Behavioral: Positive for agitation, decreased concentration and sleep disturbance. Negative for dysphoric mood and self-injury. The patient is nervous/anxious.        Advanced dementia        Objective:   Physical Exam  Constitutional: She appears well-developed and well-nourished. No distress.  Well but frail appearing elderly female with advanced dementia (here with her daughters)  HENT:  Head: Normocephalic and atraumatic.  Mouth/Throat: Oropharynx is clear and moist.  MMM   Eyes: Pupils are equal, round, and reactive to light. Conjunctivae and EOM are normal. Right eye exhibits no discharge. Left eye exhibits no discharge.  Very poor vision from macular degeneration   Neck: Normal range of motion. Neck supple. No  JVD present. Carotid bruit is not present. No thyromegaly present.  Cardiovascular: Normal rate, regular rhythm, normal heart sounds and intact distal pulses. Exam reveals no gallop.  Pulmonary/Chest: Effort normal and breath sounds normal. No respiratory distress. She has no wheezes. She has no rales.  No crackles  Abdominal: Soft. Bowel sounds are normal. She exhibits no distension, no abdominal bruit and no mass. There is no tenderness. There is no rebound and no guarding.  Exam done sitting abd is soft and entirely nontender  Musculoskeletal: She exhibits no edema or tenderness.  Kyphosis baseline  Lymphadenopathy:    She has no cervical adenopathy.  Neurological: She is alert. She has normal reflexes. No cranial nerve deficit. She exhibits normal muscle tone.  Skin: Skin is warm and dry. No rash noted. No erythema. No pallor.  No rash or skin breakdown   Bandage on R lower leg from recent skin tear  Psychiatric: Her affect is blunt and labile. Her speech is delayed and tangential. Thought content is not paranoid. Cognition and memory are impaired. She exhibits abnormal recent  memory.  Advance dementia  Will occ answer question - other times talks to herself or babbles  Seems generally cheerful today  Not attentive to the rest of the room  Somewhat anxious on and off She is inattentive.          Assessment & Plan:   Problem List Items Addressed This Visit      Cardiovascular and Mediastinum   HYPERTENSION, BENIGN ESSENTIAL    bp in fair control at this time (was anxious upon arrival)  BP Readings from Last 1 Encounters:  06/16/17 (!) 144/78   No changes needed Disc lifstyle change with low sodium diet and exercise  Labs today      Relevant Medications   amLODipine (NORVASC) 5 MG tablet   pravastatin (PRAVACHOL) 40 MG tablet   Other Relevant Orders   CBC with Differential/Platelet   Comprehensive metabolic panel   Lipid panel   TSH     Digestive   Left sided ulcerative (chronic) colitis ([place])    Family can get her to take a fraction of px mesalamine and it does help diarrhea No c/o of abd pain  Appetite is good most of the time Weight is back up  Continue to follow  Unable to get her to a GI appt         Nervous and Auditory   Senile dementia with behavioral disturbance - Primary    Daughters and husband are trying to care for her at home  This has been a struggle to prevent wandering and falls Also refuses meds frequently  Needs more help with ADLs In late stage dementia-and refuses medication with it  Will take tramadol for pain and it calms her  Husband refuses to consider skilled care  Disc poss of end of life/paliative care or hospice - they think all may be open to that and will look into it  Made it clear that safety is priority          Other   B12 deficiency    Level today  Has supplement when she accepts medication       Relevant Orders   Vitamin B12   HYPERCHOLESTEROLEMIA, PURE    Due for labs Family gives pravastatin when she will take medication  Disc diet  Appetite is good- at her age do not want to  limit it       Relevant Medications  amLODipine (NORVASC) 5 MG tablet   pravastatin (PRAVACHOL) 40 MG tablet   Other Relevant Orders   Lipid panel   Vitamin D deficiency    Level today  Takes her vit D (in setting of OP) when agreeable to medication (with dementia) No recent fractures       Relevant Orders   VITAMIN D 25 Hydroxy (Vit-D Deficiency, Fractures)

## 2017-06-16 NOTE — Assessment & Plan Note (Signed)
Level today  Takes her vit D (in setting of OP) when agreeable to medication (with dementia) No recent fractures

## 2017-06-16 NOTE — Assessment & Plan Note (Signed)
bp in fair control at this time (was anxious upon arrival)  BP Readings from Last 1 Encounters:  06/16/17 (!) 144/78   No changes needed Disc lifstyle change with low sodium diet and exercise  Labs today

## 2017-06-16 NOTE — Assessment & Plan Note (Signed)
Daughters and husband are trying to care for her at home  This has been a struggle to prevent wandering and falls Also refuses meds frequently  Needs more help with ADLs In late stage dementia-and refuses medication with it  Will take tramadol for pain and it calms her  Husband refuses to consider skilled care  Disc poss of end of life/paliative care or hospice - they think all may be open to that and will look into it  Made it clear that safety is priority

## 2017-06-16 NOTE — Patient Instructions (Signed)
I will talk to our referral coordinator regarding services available for hospice or palliative care (switching focus to comfort only)   Labs today   Look for the book (The 36 Hour Day) - it is helpful for the situation you are in with dementia

## 2017-06-17 LAB — COMPREHENSIVE METABOLIC PANEL
ALT: 7 U/L (ref 0–35)
AST: 15 U/L (ref 0–37)
Albumin: 4.1 g/dL (ref 3.5–5.2)
Alkaline Phosphatase: 90 U/L (ref 39–117)
BUN: 16 mg/dL (ref 6–23)
CALCIUM: 9.7 mg/dL (ref 8.4–10.5)
CHLORIDE: 103 meq/L (ref 96–112)
CO2: 27 meq/L (ref 19–32)
Creatinine, Ser: 0.79 mg/dL (ref 0.40–1.20)
GFR: 71.55 mL/min (ref >60.00)
GLUCOSE: 95 mg/dL (ref 70–99)
POTASSIUM: 3.8 meq/L (ref 3.5–5.1)
Sodium: 137 mEq/L (ref 135–145)
Total Bilirubin: 0.5 mg/dL (ref 0.2–1.2)
Total Protein: 7.8 g/dL (ref 6.0–8.3)

## 2017-06-17 LAB — CBC WITH DIFFERENTIAL/PLATELET
BASOS PCT: 1.1 % (ref 0.0–3.0)
Basophils Absolute: 0.1 10*3/uL (ref 0.0–0.1)
Eosinophils Absolute: 0 10*3/uL (ref 0.0–0.7)
Eosinophils Relative: 0.1 % (ref 0.0–5.0)
HCT: 42.2 % (ref 36.0–46.0)
Hemoglobin: 14 g/dL (ref 12.0–15.0)
LYMPHS ABS: 2.3 10*3/uL (ref 0.7–4.0)
Lymphocytes Relative: 36.3 % (ref 12.0–46.0)
MCHC: 33.3 g/dL (ref 30.0–36.0)
MCV: 85.3 fl (ref 78.0–100.0)
MONOS PCT: 9.7 % (ref 3.0–12.0)
Monocytes Absolute: 0.6 10*3/uL (ref 0.1–1.0)
NEUTROS ABS: 3.3 10*3/uL (ref 1.4–7.7)
NEUTROS PCT: 52.8 % (ref 43.0–77.0)
PLATELETS: 368 10*3/uL (ref 150.0–400.0)
RBC: 4.94 Mil/uL (ref 3.87–5.11)
RDW: 13.1 % (ref 11.5–15.5)
WBC: 6.3 10*3/uL (ref 4.0–10.5)

## 2017-06-17 LAB — LIPID PANEL
CHOL/HDL RATIO: 3
Cholesterol: 153 mg/dL (ref 0–200)
HDL: 48.1 mg/dL (ref >39.00)
LDL CALC: 79 mg/dL (ref 0–99)
NONHDL: 105.15
TRIGLYCERIDES: 131 mg/dL (ref 0.0–149.0)
VLDL: 26.2 mg/dL (ref 0.0–40.0)

## 2017-06-17 LAB — VITAMIN B12: Vitamin B-12: >1500 pg/mL — ABNORMAL HIGH (ref 211–911)

## 2017-06-17 LAB — TSH: TSH: 0.93 u[IU]/mL (ref 0.35–4.50)

## 2017-06-17 LAB — VITAMIN D 25 HYDROXY (VIT D DEFICIENCY, FRACTURES): VITD: 62.7 ng/mL (ref 30.00–100.00)

## 2017-06-18 ENCOUNTER — Telehealth: Payer: Self-pay | Admitting: Family Medicine

## 2017-06-18 DIAGNOSIS — F03C0 Unspecified dementia, severe, without behavioral disturbance, psychotic disturbance, mood disturbance, and anxiety: Secondary | ICD-10-CM

## 2017-06-18 DIAGNOSIS — H3530 Unspecified macular degeneration: Secondary | ICD-10-CM

## 2017-06-18 DIAGNOSIS — F0390 Unspecified dementia without behavioral disturbance: Secondary | ICD-10-CM

## 2017-06-18 NOTE — Telephone Encounter (Signed)
The hospice referral   Thanks Rosaria Ferries

## 2017-07-03 ENCOUNTER — Telehealth: Payer: Self-pay

## 2017-07-03 NOTE — Telephone Encounter (Signed)
It was the form under media they never received it and it doesn't say that it was faxed any where on the form so I faxed it to them now. Ashley House aware

## 2017-07-03 NOTE — Telephone Encounter (Signed)
Copied from [place] [id number]. Topic: General - Other >> Jul 03, 2017  9:09 AM Yvette Rack wrote: Reason for CRM: Neoma Laming from referral at [facility] [phone] calling because she had faxed over the non admit notice and the palliative request order on the 29th of April and hasn't gotten form back (F) [phone]

## 2017-07-03 NOTE — Telephone Encounter (Signed)
Is it the document from 4/26 under media?  If not I do not think I have seen it

## 2017-07-17 ENCOUNTER — Telehealth: Payer: Self-pay | Admitting: Family Medicine

## 2017-07-17 DIAGNOSIS — G309 Alzheimer's disease, unspecified: Secondary | ICD-10-CM | POA: Diagnosis not present

## 2017-07-17 NOTE — Telephone Encounter (Signed)
Copied from [place] [id number]. Topic: Quick Communication - See Telephone Encounter >> Jul 17, 2017  2:55 PM Boyd Kerbs wrote: CRM for notification. See Telephone encounter for: 07/17/17.  Leslie from [facility] -  asking about some changes that may need to be made :  Discontinue pravastatin (PRAVACHOL) 40 MG tablet  &  Cholecalciferol (VITAMIN D) 2000 UNITS CAPS  New prescription: Serquel 57m at 3pm and at bedtime  -  go up to 571mafter one week.

## 2017-07-18 MED ORDER — QUETIAPINE FUMARATE 50 MG PO TABS: ORAL_TABLET | ORAL | 11 refills | Status: DC

## 2017-07-18 NOTE — Telephone Encounter (Signed)
Verbal order given to Magda Paganini to d/c the pravastatin and vitamin D (removed from med list)  Magda Paganini did say they need an Rx sent for the [facility] [address], and she will have the pt's family pick up the new Rx

## 2017-07-18 NOTE — Telephone Encounter (Signed)
Thanks- I sent it

## 2017-07-18 NOTE — Telephone Encounter (Signed)
Those are fine with me -please change on med list  If they need a px let me know

## 2017-08-05 ENCOUNTER — Telehealth: Payer: Self-pay | Admitting: Family Medicine

## 2017-08-05 MED ORDER — HALOPERIDOL 1 MG PO TABS: 1.0000 mg | ORAL_TABLET | Freq: Two times a day (BID) | ORAL | 5 refills | Status: DC

## 2017-08-05 NOTE — Telephone Encounter (Signed)
I sent this  Please keep me posted re: how she does Thanks

## 2017-08-05 NOTE — Telephone Encounter (Signed)
Copied from [place] [id number]. Topic: Inquiry >> Aug 05, 2017 11:33 AM Margot Ables wrote: Pt having extreme issues with sleep. She has been taking Seroquel but it is not working for her. She is asking to try Haldol 6m twice per day. Please call to notify if approved and send in RX to the pharmacy.  [id number] [phone]) [phone])

## 2017-08-05 NOTE — Telephone Encounter (Signed)
Spoke to pts daughter and advised Rx sent and to keep [place] updated with status.

## 2017-08-07 NOTE — Telephone Encounter (Signed)
Received fax saying that Haloperidol is on national back order and request an alt med be sent in for pt

## 2017-08-07 NOTE — Telephone Encounter (Signed)
Please check back with the NP at Palliative care and see what else they recommend  Did seroquel not work or did she have side effects?  Thanks

## 2017-08-08 MED ORDER — HALOPERIDOL 5 MG PO TABS: 2.5000 mg | ORAL_TABLET | Freq: Two times a day (BID) | ORAL | 1 refills | Status: DC | PRN

## 2017-08-08 NOTE — Addendum Note (Signed)
Addended by: Tammi Sou on: 08/08/2017 11:32 AM   Modules accepted: Orders

## 2017-08-08 NOTE — Telephone Encounter (Addendum)
Spoke to NP at Palliative care and she said pt has tried everything so I called pharmacy and they advised me that they do have haldol 54m tabs, I advised the NP and she said she is okay with pt taking 1/2 tab up to 1 whole tab of the 547m she said she thinks the increase dose would help pt more

## 2017-08-08 NOTE — Telephone Encounter (Signed)
I sent it  

## 2017-08-08 NOTE — Addendum Note (Signed)
Addended by: Loura Pardon A on: 08/08/2017 12:36 PM   Modules accepted: Orders

## 2017-08-13 ENCOUNTER — Telehealth: Payer: Self-pay | Admitting: Family Medicine

## 2017-08-13 NOTE — Telephone Encounter (Signed)
Copied from [place] [id number]. Topic: General - Other >> Aug 13, 2017 10:29 AM Cecelia Byars, NT wrote: Reason for CRM: Magda Paganini from [facility] called and said the haldon was started a few weeks age and the patient has developed tardive  dyskinesia  as she has instructed them to stop the medication and she is  going back on  tramadol for sleep please call [phone] call for further details

## 2017-08-13 NOTE — Telephone Encounter (Signed)
Stay off the haldol- take it off med list and please put on her adv eff  Thanks

## 2017-08-13 NOTE — Telephone Encounter (Signed)
Spoke to [place]. She will advise the daughter. I removed it from her med list and placed on her allergy list.

## 2017-09-10 ENCOUNTER — Telehealth: Payer: Self-pay | Admitting: *Deleted

## 2017-09-10 ENCOUNTER — Other Ambulatory Visit: Payer: Self-pay | Admitting: *Deleted

## 2017-09-10 MED ORDER — MESALAMINE ER 0.375 G PO CP24: ORAL_CAPSULE | ORAL | 3 refills | Status: AC

## 2017-09-10 NOTE — Telephone Encounter (Signed)
Faxed form from [employer] with the supervising MD information when the patient was seen 01-12-2018.  Dr. Silvano Rusk, noted on the fax his NPI.  I sent prescription electronically for Apriso, 90 day supply, # 360 with 3 refills.  Per Amy Esterwood PA-C.

## 2017-09-26 ENCOUNTER — Other Ambulatory Visit: Payer: Self-pay | Admitting: *Deleted

## 2017-09-26 MED ORDER — TRAMADOL HCL 50 MG PO TABS: 50.0000 mg | ORAL_TABLET | Freq: Every evening | ORAL | 3 refills | Status: DC | PRN

## 2017-09-26 NOTE — Telephone Encounter (Signed)
Name of Medication: tramadol Name of [facility]acy: [facility] [address] or Written Date and Quantity: 04/09/17 #30 with 3 refills Last Office Visit and Type: med refill 06/16/17 Next Office Visit and Type: none scheduled   Last Controlled Substance Agreement Date: Not ever done Last UDS: not ever done

## 2018-02-04 ENCOUNTER — Other Ambulatory Visit: Payer: Self-pay | Admitting: *Deleted

## 2018-02-04 MED ORDER — TRAMADOL HCL 50 MG PO TABS: 50.0000 mg | ORAL_TABLET | Freq: Every evening | ORAL | 3 refills | Status: DC | PRN

## 2018-02-04 NOTE — Telephone Encounter (Signed)
Name of Medication: tramadol Name of [facility]acy[facility] [address] or Written Date and Quantity: 09/26/17 #30 with 3 refills Last Office Visit and Type: med refill 06/16/17 Next Office Visit and Type: none scheduled   Last Controlled Substance Agreement Date: Not ever done Last UDS: not ever done

## 2018-04-30 ENCOUNTER — Telehealth: Payer: Self-pay | Admitting: Nurse Practitioner

## 2018-04-30 NOTE — Telephone Encounter (Signed)
I called and spoke with Ms. Hickok daughter to schedule in-home PC follow up visit for [birth year] 1998 Wed at [place] information given

## 2018-05-06 ENCOUNTER — Telehealth: Payer: Self-pay | Admitting: Family Medicine

## 2018-05-06 NOTE — Telephone Encounter (Signed)
Best number [phone] Becky May (daughter) call stating someone called her yesterday regarding pt

## 2018-05-06 NOTE — Telephone Encounter (Signed)
I didn't call pt

## 2018-05-07 ENCOUNTER — Other Ambulatory Visit: Payer: Medicare Other | Admitting: Nurse Practitioner

## 2018-05-07 ENCOUNTER — Other Ambulatory Visit: Payer: Self-pay

## 2018-05-07 ENCOUNTER — Encounter: Payer: Self-pay | Admitting: Nurse Practitioner

## 2018-05-07 DIAGNOSIS — R1310 Dysphagia, unspecified: Secondary | ICD-10-CM

## 2018-05-07 DIAGNOSIS — Z515 Encounter for palliative care: Secondary | ICD-10-CM | POA: Insufficient documentation

## 2018-05-07 DIAGNOSIS — R413 Other amnesia: Secondary | ICD-10-CM | POA: Insufficient documentation

## 2018-05-07 DIAGNOSIS — R609 Edema, unspecified: Secondary | ICD-10-CM | POA: Insufficient documentation

## 2018-05-07 NOTE — Progress Notes (Signed)
[facility] Consult Note Telephone: [phone]  Fax: [phone]  PATIENT NAME: Ashley House DOB: [birth year] MRN: [id number]  PRIMARY CARE PROVIDER:   Abner Greenspan, MD  REFERRING PROVIDER:  Abner Greenspan, MD Ashley House, [place] [address]  [place] [phone]  RECOMMENDATIONS and PLAN:  1. Palliative care encounter Z51.5; Palliative medicine team will continue to support patient, patient's family, and medical team. Visit consisted of counseling and education dealing with the complex and emotionally intense issues of symptom management and palliative care in the setting of serious and potentially life-threatening illness  2. Dysphagia R13.10; secondary to dementia. Continue to monitor weights, appetite, aspiration precautions. Will d/c statin to decrease pill burden  3. Memory loss R41.3 secondary to dementia. Continue with supportive measures as chronic disease remains Progressive.  4. Edema R60.9 secondary to , monitor weights, elevate legs if she will allow. BP stable and will d/c norvasc as possible causing edema and decrease pill burden.   ASSESSMENT:     Ashley Register NP student and I visited and observed Ashley House with House Ashley House, and son present. Patient was sitting at her table in no acute distress. Patient initially concerned regarding visitors in the house, but was quickly pleasant. Patient confused and tangential in the speech. 90% of speech incomprehensible. Met and sat with House, Ashley House, and son. Introduced ourselves and the purpose of our visit. Also provided background of the services that palliative care provides. House explained her recent concerns regarding her mother and her steep decline over the past 3 weeks. In that time, patient has become much less mobile, worsening incontinence including diarrhea, BLE swelling, worsening dysphagia, and loss of swallow  reflex. Patient is no longer able to stand from a sitting position without assistance, has minimal ability to walk, and spends most of the day sleeping on the couch. Patient's diarrhea is said to have evidence of her Mesalamine capsules as proof of worsening absorption and House has stopped giving her the medication over the last 3 days. House attesting to mild improvement of patient's hemorrhoids over the last 2 weeks. House also expresses concerns about the decrease in oral intake by the patient including both liquids and solids. Recently, the patient has had less agitation during the day except for during bathing as well as worsening smelling urine/stools. The progression of dementia explained to the family members and how many of her recent changes are symptoms of worsening dementia. Explained to the family that she will most likely continue to decline and may get to the point where she is bedbound. With her worsening smelling urine/stool, informed to House that she could have a UTI and that she could take a sample to her PCP to be tested. Also discussed the results of worsening dysphagia and the complications involved including aspiration and pneumonia. With the worsening dysphagia and BLE swelling, recommended to the family to stop giving her amlodipine and pravastatin, already not taking cholecalciferol, cyanocobalamin, and mesalamine. Recommended House to continue to hold the Mesalamine for 2 weeks to see how it affects the patient and her bowels. Per House, patient continues to take tramadol 5m qHS which aids in the family getting the patient to bed but does not appear to provide any benefit of staying asleep.  Goals of care discussed with the family and their goal is to keep the patient at home and comfortable. Hospice information was verbalized to the family  and described the services and equipment that they can provide. Eligibility described to the family and recommended obtaining new  labs including comprehensive metabolic panel. At this time, advanced care planning was completed with the completion of a DNR and MOST forms. Planned follow up in 1 month.   Measurements Rt Arm - 7in Rt Leg - 14in BP- 120/66  06/16/17, 98.2F, 144/78, 78, 95%. Weight at this time, 56.5 kg. Most recent labs, Na 137, K 3.8, Cl 27, Glucose 95, BUN 16, Cr 0.79, Calcium 9.7, GFR 71.55, Albumin 4.1, Vit D 62.7, Vit B12 >1500, WBC 6.3, Hgb, 42.2, Hct 42.2, Plts 368, TSH 0.93.   I spent 90 minutes providing this consultation,  from 10:00am to 11:30am. More than 50% of the time in this consultation was spent coordinating communication.   HISTORY OF PRESENT ILLNESS:  Ashley House is a 85 y.o. year old female with multiple medical problems including hypertension, Ulcerative colitis, Dementia with behavioral disturbances, osteoarthritis, osteoporosis, falls, macular degeneration, legally blind, overactive bladder, anxiety, anemia, hyperlipidemia, kyphosis, vitamin D deficiency, and degenerative disc disease. Patient has a history of frequent agitation, worsening at night requiring family to use bed alarms and stay awake most nights. Initial consult for palliative care was discussed by the patient's PCP per documentation on 06/18/2017 and has been followed by Ashley House previously known as Ashley House and hospice. Trial attempts of Seroquel and Haloperidol has been unsuccessful with controlling nighttime agitation with haloperidol causing tardive dyskinesia. Per PCP, patient started on Tramadol 28m nightly and currently remains on this. Last hospitalization, 06/2010, patient presented with rectal bleeding due to hemorrhoids and was positive with a UTI. Most recent vitals from Per documentation, patient is actively taking Cholecalciferol 2000U daily, Cyanocobalamin 10072mdaily, Mesalamine 0.37523m tablets BID, Tramadol 14m34mS and PRN for moderate to severe pain, Amlodipine 5mg 22mly, and  Pravastatin 20mg 39my. At present, Ashley House, fragile, elderly sitting in a chair in the kitchen, appears confused, comfortable. House and son present. Palliative Care was asked to help address goals of care.   CODE STATUS: DNR  PPS: 40% HOSPICE ELIGIBILITY/DIAGNOSIS: TBD  PAST MEDICAL HISTORY:  Past Medical History:  Diagnosis Date  . Allergy   . Anemia   . DDD (degenerative disc disease)   . Dementia, senile (H[facility])  [place]   . Edema   . Full dentures   . Gastritis   . Hypertension   . Macular degeneration   . Macular degeneration    no center vision  . Osteoporosis   . Overactive bladder     SOCIAL HX:  Social History   Tobacco Use  . Smoking status: Never Smoker  . Smokeless tobacco: Former User  Network engineeropics  . Alcohol use: No    Alcohol/week: 0.0 standard drinks    ALLERGIES:  Allergies  Allergen Reactions  . Ace Inhibitors   . Atorvastatin   . Ezetimibe-Simvastatin   . Guanfacine Hcl   . Haldol [Haloperidol] Other (See Comments)  . Tolterodine Tartrate     REACTION: does not work     PERTINENT MEDICATIONS:  Outpatient Encounter Medications as of 05/07/2018  Medication Sig  . amLODipine (NORVASC) 5 MG tablet Take 1 tablet (5 mg total) by mouth daily.  . Cyanocobalamin (VITAMIN B 12 PO) Take 1,000 mg by mouth.  . mesalamine (APRISO) 0.375 g 24 hr capsule TAKE 2 CAPSULES BY MOUTH  TWICE A DAY  . traMADol (ULTRAM) 50 MG tablet Take 1  tablet (50 mg total) by mouth at bedtime as needed for moderate pain or severe pain. Caution of sedation   No facility-administered encounter medications on file as of 05/07/2018.     PHYSICAL EXAM:   General: NAD, frail appearing, thin, confused elderly female Cardiovascular: regular rate and rhythm Pulmonary: clear ant fields Abdomen: soft, nontender, + bowel sounds GU: no suprapubic tenderness Extremities: no edema, no joint deformities Skin: no rashes Neurological: Weakness but  otherwise nonfocal   Ihor Gully, NP

## 2018-06-01 ENCOUNTER — Telehealth: Payer: Self-pay | Admitting: Nurse Practitioner

## 2018-06-01 NOTE — Telephone Encounter (Signed)
I called Jacqlyn Larsen, Ms Hanif's daughter to schedule follow-up phone call for Telehealth Zoom appointment. Becky and I talked about Ms. Gerrard clinical condition. She does continue to have a decline. Inez Catalina endorses the evening after the palliative care visit Ms Hornback exhibited what appears to be TIA symptoms. She lost use of her leg. She was not able to stand they had to carry her. By morning Becky endorses it improved. She shared that she continues to have diarrhea, decreased appetite and confusion. Becky endorses her urine output has decreased. We talked about option of Hospice Services. Telephone calls / Telehealth visit schedule for tomorrow for further discussion of [place]  Total time spent 30 minutes Documentation 10 minutes Phone discussion 20 minutes

## 2018-06-02 ENCOUNTER — Encounter: Payer: Self-pay | Admitting: Nurse Practitioner

## 2018-06-02 ENCOUNTER — Other Ambulatory Visit: Payer: Self-pay

## 2018-06-02 ENCOUNTER — Telehealth: Payer: Self-pay | Admitting: Family Medicine

## 2018-06-02 ENCOUNTER — Other Ambulatory Visit: Payer: Medicare Other | Admitting: Nurse Practitioner

## 2018-06-02 DIAGNOSIS — F03C0 Unspecified dementia, severe, without behavioral disturbance, psychotic disturbance, mood disturbance, and anxiety: Secondary | ICD-10-CM

## 2018-06-02 DIAGNOSIS — R1310 Dysphagia, unspecified: Secondary | ICD-10-CM

## 2018-06-02 DIAGNOSIS — Z515 Encounter for palliative care: Secondary | ICD-10-CM

## 2018-06-02 DIAGNOSIS — F0390 Unspecified dementia without behavioral disturbance: Secondary | ICD-10-CM

## 2018-06-02 DIAGNOSIS — R413 Other amnesia: Secondary | ICD-10-CM | POA: Diagnosis not present

## 2018-06-02 DIAGNOSIS — G459 Transient cerebral ischemic attack, unspecified: Secondary | ICD-10-CM

## 2018-06-02 NOTE — Progress Notes (Signed)
[facility] Consult Note Telephone: [phone]  Fax: [phone]  PATIENT NAME: Ashley House DOB: 02/06/1919 MRN: [id number]  PRIMARY CARE PROVIDER:   Abner Greenspan, MD  REFERRING PROVIDER:  Abner Greenspan, MD Ashley House,  [id number]  RESPONSIBLE PARTY:     Ashley House May daughter [phone]  Due to the COVID-19 crisis, this visit was attempted to be done via telemedicine from my office and it was initiated and consent by this patient and or family, but unable so transitioned to telephone call.  ASSESSMENT:     I called Becky, Ashley. Ashley House's daughter as telephone scheduled appointment. Verbal consent obtained. We talked about update with last few days on how Ashley House is doing. Ashley House endorses that she continues to have good and bad days. She continues to have more difficult times in the evening. She is sleeping more. She is having more trouble with regular food as she is getting choked and at times pocketing her food. We talked about appetite being poor. We talked about event where she lost the use of her leg and was unable to ambulate until the next morning. Ashley House endorses it is gotten to the point where she is having to hold Ashley House up to help ambulate which has changed in the last six to nine months where she was ambulating independently. We talked about medical goals which focus on Comfort. We Revisited option of Hospice Services and Ashley House endorses she wishes for hospice screening. Therapeutic listening and emotional support provided. Contact information provided. Message sent to Ashley House requesting hospice screening order.   RECOMMENDATIONS and PLAN:  1. Palliative care encounter Z51.5; Palliative medicine team will continue to support patient, patient's family, and medical team. Visit consisted of counseling and education dealing with the complex and emotionally intense issues of symptom management and palliative care  in the setting of serious and potentially life-threatening illness  2. Dysphagia R13.10; secondary to dementia. Continue to monitor weights, appetite, aspiration precautions. Will d/c statin to decrease pill burden  3. Memory loss R41.3 secondary to dementia. Continue with supportive measures as chronic disease remains Progressive.  I spent 45 minutes providing this consultation,  from 10:00am to 10:45am. More than 50% of the time in this consultation was spent coordinating communication.   HISTORY OF PRESENT ILLNESS:  Ashley House is a 85 y.o. year old female with multiple medical problems including hypertension, Ulcerative colitis, Dementia with behavioral disturbances, osteoarthritis, osteoporosis, falls, macular degeneration, legally blind, overactive bladder, anxiety, anemia, hyperlipidemia, kyphosis, vitamin D deficiency, and degenerative disc disease. Concerns regarding her mother and her steep decline over the past 6 weeks. In that time, patient has become much less mobile, worsening incontinence including diarrhea, BLE swelling, worsening dysphagia, and loss of swallow reflex. Patient is no longer able to stand from a sitting position without assistance, has minimal ability to walk, and spends most of the day sleeping on the couch. She is total ADL, incontienct bowel/bladder. She needs prompting encouraging to eat with choking episodes. Having to change foods. She is also pocketing foods. Cognitive functions declined, difficulty with speech, mumbles most of the time. Palliative Care was asked to help address goals of care.   CODE STATUS: DNR.   PPS:  HOSPICE ELIGIBILITY/DIAGNOSIS: appears with clinical decline <6 months  PAST MEDICAL HISTORY:  Past Medical History:  Diagnosis Date  . Allergy   . Anemia   . DDD (degenerative disc disease)   .  Dementia, senile ([place] [facility])   . Diverticulosis   . Edema   . Full dentures   . Gastritis   . Hypertension   . Macular degeneration   .  Macular degeneration    no center vision  . Osteoporosis   . Overactive bladder     SOCIAL HX:  Social History   Tobacco Use  . Smoking status: Never Smoker  . Smokeless tobacco: Former Network engineer Use Topics  . Alcohol use: No    Alcohol/week: 0.0 standard drinks    ALLERGIES:  Allergies  Allergen Reactions  . Ace Inhibitors   . Atorvastatin   . Ezetimibe-Simvastatin   . Guanfacine Hcl   . Haldol [Haloperidol] Other (See Comments)  . Tolterodine Tartrate     REACTION: does not work     PERTINENT MEDICATIONS:  Outpatient Encounter Medications as of 06/02/2018  Medication Sig  . amLODipine (NORVASC) 5 MG tablet Take 1 tablet (5 mg total) by mouth daily.  . Cyanocobalamin (VITAMIN B 12 PO) Take 1,000 mg by mouth.  . mesalamine (APRISO) 0.375 g 24 hr capsule TAKE 2 CAPSULES BY MOUTH  TWICE A DAY  . traMADol (ULTRAM) 50 MG tablet Take 1 tablet (50 mg total) by mouth at bedtime as needed for moderate pain or severe pain. Caution of sedation   No facility-administered encounter medications on file as of 06/02/2018.     PHYSICAL EXAM:   Deferred telephone visit Ashley Ashley Gully, NP

## 2018-06-02 NOTE — Telephone Encounter (Signed)
Hospice Form ready for Dr Glori Bickers to fill out and fax to [place], will be placed on Dr Alba Cory desk.

## 2018-06-04 ENCOUNTER — Telehealth: Payer: Self-pay

## 2018-06-04 MED ORDER — LORAZEPAM 0.5 MG PO TABS: 0.5000 mg | ORAL_TABLET | Freq: Two times a day (BID) | ORAL | 1 refills | Status: AC | PRN

## 2018-06-04 NOTE — Telephone Encounter (Signed)
Erasmo Downer notified of Dr. Marliss Coots comments and recommendations

## 2018-06-04 NOTE — Telephone Encounter (Signed)
Erasmo Downer nurse with [facility] started seeing pt on 06/03/18. Minette Headland wants to know 1) is the current regimen for colitis still appropriate; using immodium and stool softeners when needed.  2) pt having swelling in legs and feet and wants to know if Lasix might be an option for swelling. 3)pt has sundowners and agitation; benadryl makes pt restless; pt presently taking some left over tramadol from shoulder surgery and Erasmo Downer wants to know if Ativan might be possible choice for this. Kristin request cb after Dr Glori Bickers reviews. [facility].

## 2018-06-04 NOTE — Telephone Encounter (Signed)
Current regimen is ok for colitis I would not treat leg swelling unless it was making her uncomfortable  I will sent ativan to try - use with caution of sedation and falls

## 2018-06-11 ENCOUNTER — Telehealth: Payer: Self-pay

## 2018-06-11 MED ORDER — TRAMADOL HCL 50 MG PO TABS: 50.0000 mg | ORAL_TABLET | Freq: Every evening | ORAL | 5 refills | Status: DC | PRN

## 2018-06-11 NOTE — Telephone Encounter (Signed)
What pharmacy?  She has multiple listed  Have they changed the dosing regimen at all?  Thanks

## 2018-06-11 NOTE — Telephone Encounter (Signed)
They only give med to pt and bedtime prn and it is helping her a lot, she said they tried the ativan and benadryl and that doesn't help at all but they would like to keep giving her the tramadol at bedtime since it's helping   [facility]/ [address]

## 2018-06-11 NOTE — Telephone Encounter (Signed)
Inbound call to triage - request for another refill for Tramadol for pain and behavior management. Family states patient is much calmer after taking this medication. Not continuing with Lorazepam as negative behavior increased.

## 2018-07-06 ENCOUNTER — Telehealth: Payer: Self-pay | Admitting: *Deleted

## 2018-07-06 NOTE — Telephone Encounter (Signed)
Erasmo Downer called requesting a refill on Tramadol for patient. Zandra Abts that a script was sent in on 06/11/18 #30/5 refills. Erasmo Downer stated that she did not realize that patient had refills and she will call the pharmacy for the refill.

## 2018-09-15 ENCOUNTER — Telehealth: Payer: Self-pay

## 2018-09-15 MED ORDER — TRAZODONE HCL 50 MG PO TABS: 50.0000 mg | ORAL_TABLET | Freq: Every day | ORAL | 11 refills | Status: AC

## 2018-09-15 NOTE — Telephone Encounter (Signed)
Rx sent to pharmacy and left VM letting Christine know Dr. Marliss Coots instructions recommendations

## 2018-09-15 NOTE — Telephone Encounter (Signed)
Left VM letting Ashley House know Dr. Marliss Coots comments

## 2018-09-15 NOTE — Telephone Encounter (Signed)
She did receive the msg and is appreciative of that information. Pt was given immodium- no results yet. Erasmo Downer (with [facility] [phone]) would like to know your thoughts on prescribing trazodone to help pt sleep. She does not show that this has been tried before.

## 2018-09-15 NOTE — Telephone Encounter (Signed)
I do not think she has had that (can double check with family but I did a search in epic)  Could start with trazodone 50 mg 1 po qhs #30 11 ref  Watch for side effects-sedation/fall /mood change

## 2018-09-15 NOTE — Telephone Encounter (Signed)
Barrier cream or ointment is about the only thing to work with in this situation-(like A and D) - apply after cleaning after BMs to protect skin from stool    immodium is ok if helpful  If certain things in diet make her worse (I don't think so) that is also a consideration

## 2018-09-15 NOTE — Addendum Note (Signed)
Addended by: Tammi Sou on: 09/15/2018 03:15 PM   Modules accepted: Orders

## 2018-09-15 NOTE — Telephone Encounter (Signed)
Christine nurse with [facility] said pt has hx of colitis; pt having constant flow of tarry thick stools. Pt is eating very little. Christine advised pt's daughter to give an immodium. Altha Harm said pts bottom is "torn up". Altha Harm is going to see pt today and wanted to know any other suggestions to help pt. Altha Harm said it is difficult to apply anything to her bottom due to constant flow of stool. Please advise. [facility].

## 2018-12-03 ENCOUNTER — Telehealth: Payer: Self-pay

## 2018-12-03 NOTE — Telephone Encounter (Signed)
Spoke with Leafy Ro and Junie Panning and they advise me that we can not do this. Our flu shot automatically bills for the admin fee and that could potentially be insurance fraud since we didn't bill it. There is no way to give a flu vaccine without one of [place] giving it to her, called Altha Harm and advise of Mandy's comments

## 2018-12-03 NOTE — Telephone Encounter (Signed)
Christine nurse with [facility] left v/m requesting order to administer flu shot in the home. Altha Harm said she could come to [facility] to pick up flu shot for pt on 12/04/18 in AM. Altha Harm request cb.

## 2018-12-03 NOTE — Telephone Encounter (Signed)
That is fine 

## 2018-12-08 ENCOUNTER — Other Ambulatory Visit: Payer: Self-pay | Admitting: Family Medicine

## 2018-12-09 NOTE — Telephone Encounter (Signed)
Name of Medication: Tramadol Name of Pharmacy: [facility]/ [address] or Written Date and Quantity: 06/11/18 Last Office Visit and Type: 06/16/17 Next Office Visit and Type: none scheduled (Hospice pt)

## 2019-01-05 ENCOUNTER — Other Ambulatory Visit: Payer: Self-pay | Admitting: Family Medicine

## 2019-01-06 NOTE — Telephone Encounter (Signed)
Name of Medication: Tramadol Name of Pharmacy: [facility]/ [place] or Written Date and Quantity:12/09/18 #30 tabs with 0 refills Last Office Visit and Type: 06/16/17 Next Office Visit and Type: none scheduled (Hospice pt)

## 2019-05-04 ENCOUNTER — Other Ambulatory Visit: Payer: Self-pay | Admitting: *Deleted

## 2019-05-04 MED ORDER — TRAMADOL HCL 50 MG PO TABS: 50.0000 mg | ORAL_TABLET | Freq: Every evening | ORAL | 3 refills | Status: DC | PRN

## 2019-05-04 NOTE — Telephone Encounter (Signed)
Ashley House with [facility] left a voicemail stating that patient is in need of her Tramadol and requested that a refill be sent in.  Last refill 01/06/19 #30/3 Last office visit 06/16/17 Patient is a Hospice patient

## 2019-05-09 ENCOUNTER — Other Ambulatory Visit: Payer: Self-pay | Admitting: Family Medicine

## 2019-05-11 NOTE — Telephone Encounter (Signed)
Rx was filled on 05/04/19 #30 tabs with 3 refills but it looks like it went to mail order in [place], please advise

## 2019-06-26 DEATH — deceased

## 2019-07-13 ENCOUNTER — Telehealth: Payer: Self-pay | Admitting: Family Medicine

## 2019-07-13 NOTE — Telephone Encounter (Signed)
Unless I signed them already- they probably have not made it to my in box  I will keep an eye out for them

## 2019-07-13 NOTE — Telephone Encounter (Signed)
Ashley House with [facility] called She stated that orders where faxed and they are still needing these sent back to them She stated it is for 4/3 and 4/23   Ashley House is requesting a call back with update [phone]

## 2019-08-12 NOTE — Telephone Encounter (Signed)
I will watch for them

## 2019-08-12 NOTE — Telephone Encounter (Signed)
Hassan Rowan called back Advised her of message from Dr Glori Bickers and if she could refax these to [place].  Hassan Rowan stated she will fax these back over but it will be tomorrow before they could  [facility]
